# Patient Record
Sex: Female | Born: 2005 | Race: Black or African American | Hispanic: No | Marital: Single | Smoking: Never smoker
Health system: Southern US, Community
[De-identification: ages and names within clinical notes are randomized; demographics above are authoritative.]

## PROBLEM LIST (undated history)

## (undated) DIAGNOSIS — L309 Dermatitis, unspecified: Secondary | ICD-10-CM

## (undated) DIAGNOSIS — T7840XA Allergy, unspecified, initial encounter: Secondary | ICD-10-CM

## (undated) DIAGNOSIS — N390 Urinary tract infection, site not specified: Secondary | ICD-10-CM

## (undated) DIAGNOSIS — D571 Sickle-cell disease without crisis: Secondary | ICD-10-CM

---
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
## 2023-02-19 ENCOUNTER — Other Ambulatory Visit: Payer: Self-pay

## 2023-02-19 ENCOUNTER — Ambulatory Visit (HOSPITAL_COMMUNITY)
Admission: EM | Admit: 2023-02-19 | Discharge: 2023-02-19 | Disposition: A | Discharge status: Home / Self Care | Payer: Medicaid Other | Attending: Nurse Practitioner | Admitting: Nurse Practitioner

## 2023-02-19 ENCOUNTER — Encounter (HOSPITAL_COMMUNITY): Payer: Self-pay | Admitting: Emergency Medicine

## 2023-02-19 DIAGNOSIS — S161XXA Strain of muscle, fascia and tendon at neck level, initial encounter: Secondary | ICD-10-CM

## 2023-02-19 DIAGNOSIS — S39012A Strain of muscle, fascia and tendon of lower back, initial encounter: Secondary | ICD-10-CM

## 2023-02-19 MED ORDER — CYCLOBENZAPRINE HCL 10 MG PO TABS: 10.0000 mg | ORAL_TABLET | Freq: Two times a day (BID) | ORAL | 0 refills | Status: DC
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
## 2023-02-19 NOTE — ED Notes (Signed)
Patient is pleasant, NAD.  Patient has warm fingers, compared pulse ox reading with 2 pieces of equipment and used fingers from both hands.  Patient does not have artificial nails.
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
## 2023-02-19 NOTE — Discharge Instructions (Addendum)
Start taking Tylenol 500 to 1000 mg every 6 hours as needed for pain.  Recommend starting this today.  In addition, you can alternate with ibuprofen 800 mg every 8 hours for pain.  At night time or as needed during the day if you are not going anywhere, you can take the Flexeril every 12 hours as needed for muscular pain.  Seek care if symptoms persist/worsen despite treatment.

## 2023-03-07 ENCOUNTER — Telehealth: Payer: Self-pay | Admitting: Family Medicine

## 2023-03-07 NOTE — Telephone Encounter (Signed)
NOT OUR PT. Never been seen here. Pt's PCP is not in our system so I cannot forward this message to them

## 2023-03-07 NOTE — Telephone Encounter (Signed)
Copied from CRM [id number]. Topic: Medical Record Request - Records Request >> Mar 05, 2023  1:51 PM Fuller Mandril wrote: Reason for CRM: Pt called to request that discharge paperwork from last appt during the summer be sent to [employer] so that they are able to order her supplies. Fax #: [phone]

## 2023-07-27 ENCOUNTER — Encounter (HOSPITAL_COMMUNITY): Payer: Self-pay

## 2023-07-27 ENCOUNTER — Ambulatory Visit (HOSPITAL_COMMUNITY)
Admission: EM | Admit: 2023-07-27 | Discharge: 2023-07-27 | Disposition: A | Discharge status: Home / Self Care | Attending: Family Medicine | Admitting: Family Medicine

## 2023-07-27 DIAGNOSIS — Z113 Encounter for screening for infections with a predominantly sexual mode of transmission: Secondary | ICD-10-CM | POA: Insufficient documentation

## 2023-07-27 DIAGNOSIS — K629 Disease of anus and rectum, unspecified: Secondary | ICD-10-CM | POA: Insufficient documentation

## 2023-07-27 MED ORDER — LIDOCAINE 5 % EX OINT: 1.0000 | TOPICAL_OINTMENT | Freq: Two times a day (BID) | CUTANEOUS | 0 refills | Status: DC | PRN

## 2023-07-27 MED ORDER — VALACYCLOVIR HCL 1 G PO TABS: 1000.0000 mg | ORAL_TABLET | Freq: Two times a day (BID) | ORAL | 0 refills | Status: AC

## 2023-07-27 NOTE — ED Triage Notes (Signed)
 Patient would like STI testing. Patient reports blister around her anus area x 3 days.

## 2023-07-27 NOTE — Discharge Instructions (Addendum)
  It was nice seeing you today. 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 I have e-scribed your medication to the pharmacy. Please take: Valacyclovir as instructed  Everett Hitt, our registered nurse, will call you about all your test results and follow-up plans if needed. Please follow up with your PCP soon and return to us  or the emergency department if your symptoms worsen.  Best regards, [facility] Provider

## 2023-07-27 NOTE — ED Provider Notes (Addendum)
 [facility]    CSN: [id number] Arrival date & time: 07/27/23  1635      History   Chief Complaint No chief complaint on file.   HPI Jessica Macias is a 18 y.o. female.   The history is provided by the patient. No language interpreter was used.  Rash Location: C/O bumpy rash on her anus x  4 days. Quality: burning and itchiness   Severity:  Moderate Onset quality:  Gradual Duration:  3 days Timing:  Constant Progression:  Worsening Chronicity:  New Context: not animal contact, not insect bite/sting, not medications and not sick contacts   Context comment:  She is sexually active. Denies anual sex. Symptoms started after she got [place] wax Relieved by:  Nothing Worsened by:  Nothing Ineffective treatments: Warm compression. Associated symptoms: no abdominal pain, no diarrhea, no fever, no nausea and not vomiting   Associated symptoms comment:  No vaginal discharge, but wants STD test. Had recent HIV test done. She does not want blood test today STD screen: LMP:07/07/23 [place] wax recently  Past Medical History:  Diagnosis Date  . Allergy    seasonal allergies  . Eczema   . Sickle cell anemia (HCC)    dx at birth  . Urinary tract infection     Patient Active Problem List   Diagnosis Date Noted  . Hypoxia 09/26/2022  . Sickle cell crisis (HCC) 09/21/2022  . Sickle cell anemia (HCC) 02/19/2012  . Acute chest syndrome (HCC) 02/19/2012  . Fever 02/19/2012    History reviewed. No pertinent surgical history.  OB History   No obstetric history on file.      Home Medications    Prior to Admission medications   Medication Sig Start Date End Date Taking? Authorizing Provider  lidocaine  (XYLOCAINE ) 5 % ointment Apply 1 Application topically 2 (two) times daily as needed. 07/27/23  Yes Arn Lane, MD  norethindrone (MICRONOR) 0.35 MG tablet Take 1 tablet by mouth daily. 05/05/23  Yes [provider]  valACYclovir (VALTREX) 1000 MG  tablet Take 1 tablet (1,000 mg total) by mouth 2 (two) times daily for 10 days. Take for 10 days 07/27/23 08/06/23 Yes Penni Bowman T, MD  acetaminophen  (TYLENOL ) 500 MG tablet Take 1,000 mg by mouth as needed for moderate pain.    [provider]  cyclobenzaprine  (FLEXERIL ) 10 MG tablet Take 1 tablet (10 mg total) by mouth every 12 (twelve) hours. Do not take with alcohol or while driving or operating heavy machinery.  May cause drowsiness. 02/19/23   Wilhemena Harbour, NP  folic acid  (FOLVITE ) 1 MG tablet Take 1 mg by mouth daily.    [provider]  hydroxyurea  (DROXIA ) 400 MG capsule Take 1,200 mg by mouth daily.    [provider]  ibuprofen  (ADVIL ) 800 MG tablet Take 1 tablet (800 mg total) by mouth every 8 (eight) hours as needed for moderate pain, mild pain, headache or fever. 09/26/22   Sigurd Driver, FNP  oxyCODONE  (ROXICODONE ) 5 MG immediate release tablet Take 1 tablet (5 mg total) by mouth every 6 (six) hours as needed for severe pain. 09/26/22   Sigurd Driver, FNP  Vitamin D, Ergocalciferol, (DRISDOL) 1.25 MG (50000 UNIT) CAPS capsule Take 50,000 Units by mouth every 7 (seven) days.    [provider]    Family History Family History  Problem Relation Age of Onset  . Sickle cell trait Mother   . Diabetes Mother   . Hypertension Mother   .  Sickle cell trait Father   . Diabetes Maternal Grandmother   . Hypertension Maternal Grandmother   . Hyperlipidemia Maternal Grandmother     Social History Social History   Tobacco Use  . Smoking status: Never  Vaping Use  . Vaping status: Never Used  Substance Use Topics  . Alcohol use: Never  . Drug use: Never     Allergies   Patient has no known allergies.   Review of Systems Review of Systems  Constitutional:  Negative for fever.  Gastrointestinal:  Negative for abdominal pain, diarrhea, nausea and vomiting.  Skin:  Positive for rash.  All other systems reviewed and are  negative.    Physical Exam Triage Vital Signs ED Triage Vitals  Encounter Vitals Group     BP      Systolic BP Percentile      Diastolic BP Percentile      Pulse      Resp      Temp      Temp src      SpO2      Weight      Height      Head Circumference      Peak Flow      Pain Score      Pain Loc      Pain Education      Exclude from Growth Chart    No data found.  Updated Vital Signs BP 116/70 (BP Location: Left Arm)   Pulse 91   Temp 98.6 F (37 C) (Oral)   Resp 17   LMP 07/13/2023 (Approximate)   SpO2 99%   Visual Acuity Right Eye Distance:   Left Eye Distance:   Bilateral Distance:    Right Eye Near:   Left Eye Near:    Bilateral Near:     Physical Exam Vitals and nursing note reviewed. Exam conducted with a chaperone present [facility]).  Cardiovascular:     Rate and Rhythm: Normal rate and regular rhythm.     Heart sounds: Normal heart sounds. No murmur heard. Pulmonary:     Effort: Pulmonary effort is normal. No respiratory distress.     Breath sounds: Normal breath sounds. No wheezing.  Genitourinary:    Comments: 1- 2 cm round shallow perianal ulcer around 6 o'clock with a two smaller  lesions , painful to touch     UC Treatments / Results  Labs (all labs ordered are listed, but only abnormal results are displayed) Labs Reviewed  HSV CULTURE AND TYPING  CERVICOVAGINAL ANCILLARY ONLY    EKG   Radiology No results found.  Procedures Procedures (including critical care time)  Medications Ordered in UC Medications - No data to display  Initial Impression / Assessment and Plan / UC Course  I have reviewed the triage vital signs and the nursing notes.  Pertinent labs & imaging results that were available during my care of the patient were reviewed by me and considered in my medical decision making (see chart for details).  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 I have e-scribed your medication to the pharmacy. Please take: Valacyclovir as instructed  Everett Hitt, our registered nurse, will call you about all your test results and follow-up plans if needed. Please follow up with your PCP soon and return to us  or the emergency department if your symptoms worsen.  Best regards, [facility] Provider     ED Prescriptions     Medication Sig Dispense Auth. Provider   valACYclovir (VALTREX) 1000 MG tablet Take 1 tablet (1,000 mg total) by mouth 2 (two) times daily for 10 days. Take for 10 days 20 tablet Lancer Thurner, Raye Cai T, MD   lidocaine  (XYLOCAINE ) 5 % ointment Apply 1 Application topically 2 (two) times daily as needed. 35.44 g Arn Lane, MD      PDMP not reviewed this encounter.   Arn Lane, MD 07/27/23 1712    Arn Lane, MD 07/27/23 [phone]

## 2023-07-28 ENCOUNTER — Telehealth (HOSPITAL_COMMUNITY): Payer: Self-pay

## 2023-07-28 LAB — CERVICOVAGINAL ANCILLARY ONLY
Chlamydia: POSITIVE — AB
Comment: NEGATIVE
Comment: NEGATIVE
Comment: NORMAL
Neisseria Gonorrhea: NEGATIVE
Trichomonas: NEGATIVE

## 2023-07-28 MED ORDER — DOXYCYCLINE HYCLATE 100 MG PO TABS: 100.0000 mg | ORAL_TABLET | Freq: Two times a day (BID) | ORAL | 0 refills | Status: AC

## 2023-07-28 NOTE — Telephone Encounter (Signed)
 Per protocol, pt requires tx with Doxycycline.  Reviewed with patient, verified pharmacy, prescription sent.

## 2023-07-30 ENCOUNTER — Encounter: Payer: Self-pay | Admitting: Family Medicine

## 2023-08-01 LAB — HSV CULTURE AND TYPING

## 2023-08-06 ENCOUNTER — Emergency Department (HOSPITAL_COMMUNITY)

## 2023-08-06 ENCOUNTER — Other Ambulatory Visit: Payer: Self-pay

## 2023-08-06 ENCOUNTER — Emergency Department (HOSPITAL_COMMUNITY): Admission: EM | Admit: 2023-08-06 | Discharge: 2023-08-06 | Discharge status: Against Medical Advice

## 2023-08-06 ENCOUNTER — Encounter (HOSPITAL_COMMUNITY): Payer: Self-pay

## 2023-08-06 ENCOUNTER — Encounter (HOSPITAL_COMMUNITY): Payer: Self-pay | Admitting: *Deleted

## 2023-08-06 ENCOUNTER — Emergency Department (HOSPITAL_COMMUNITY)
Admission: EM | Admit: 2023-08-06 | Discharge: 2023-08-07 | Disposition: A | Discharge status: Home / Self Care | Source: Home / Self Care

## 2023-08-06 DIAGNOSIS — Z5321 Procedure and treatment not carried out due to patient leaving prior to being seen by health care provider: Secondary | ICD-10-CM | POA: Diagnosis not present

## 2023-08-06 DIAGNOSIS — R791 Abnormal coagulation profile: Secondary | ICD-10-CM | POA: Insufficient documentation

## 2023-08-06 DIAGNOSIS — M549 Dorsalgia, unspecified: Secondary | ICD-10-CM | POA: Diagnosis present

## 2023-08-06 DIAGNOSIS — D57219 Sickle-cell/Hb-C disease with crisis, unspecified: Secondary | ICD-10-CM | POA: Diagnosis not present

## 2023-08-06 DIAGNOSIS — D5700 Hb-SS disease with crisis, unspecified: Secondary | ICD-10-CM | POA: Diagnosis present

## 2023-08-06 DIAGNOSIS — D72829 Elevated white blood cell count, unspecified: Secondary | ICD-10-CM | POA: Diagnosis not present

## 2023-08-06 LAB — COMPREHENSIVE METABOLIC PANEL WITH GFR
ALT: 13 U/L (ref 0–44)
ALT: 12 U/L (ref 0–44)
AST: 37 U/L (ref 15–41)
AST: 38 U/L (ref 15–41)
Albumin: 4.5 g/dL (ref 3.5–5.0)
Albumin: 5 g/dL (ref 3.5–5.0)
Alkaline Phosphatase: 70 U/L (ref 38–126)
Alkaline Phosphatase: 91 U/L (ref 38–126)
Anion gap: 9 (ref 5–15)
Anion gap: 8 (ref 5–15)
BUN: <5 mg/dL — ABNORMAL LOW (ref 6–20)
BUN: 5 mg/dL — ABNORMAL LOW (ref 6–20)
CO2: 22 mmol/L (ref 22–32)
CO2: 24 mmol/L (ref 22–32)
Calcium: 9.6 mg/dL (ref 8.9–10.3)
Calcium: 9.9 mg/dL (ref 8.9–10.3)
Chloride: 107 mmol/L (ref 98–111)
Chloride: 107 mmol/L (ref 98–111)
Creatinine, Ser: 0.52 mg/dL (ref 0.44–1.00)
Creatinine, Ser: 0.49 mg/dL (ref 0.44–1.00)
GFR, Estimated: >60 mL/min (ref >60)
GFR, Estimated: >60 mL/min (ref >60)
Glucose, Bld: 102 mg/dL — ABNORMAL HIGH (ref 70–99)
Glucose, Bld: 105 mg/dL — ABNORMAL HIGH (ref 70–99)
Potassium: 4 mmol/L (ref 3.5–5.1)
Potassium: 3.9 mmol/L (ref 3.5–5.1)
Sodium: 138 mmol/L (ref 135–145)
Sodium: 139 mmol/L (ref 135–145)
Total Bilirubin: 1.9 mg/dL — ABNORMAL HIGH (ref 0.0–1.2)
Total Bilirubin: 2.7 mg/dL — ABNORMAL HIGH (ref 0.0–1.2)
Total Protein: 7.6 g/dL (ref 6.5–8.1)
Total Protein: 8.9 g/dL — ABNORMAL HIGH (ref 6.5–8.1)

## 2023-08-06 LAB — CBC WITH DIFFERENTIAL/PLATELET
Abs Immature Granulocytes: 0.43 10*3/uL — ABNORMAL HIGH (ref 0.00–0.07)
Abs Immature Granulocytes: 0.51 10*3/uL — ABNORMAL HIGH (ref 0.00–0.07)
Basophils Absolute: 0.1 10*3/uL (ref 0.0–0.1)
Basophils Absolute: 0.2 10*3/uL — ABNORMAL HIGH (ref 0.0–0.1)
Basophils Relative: 1 %
Basophils Relative: 1 %
Eosinophils Absolute: 0 10*3/uL (ref 0.0–0.5)
Eosinophils Absolute: 0 10*3/uL (ref 0.0–0.5)
Eosinophils Relative: 0 %
Eosinophils Relative: 0 %
HCT: 21.4 % — ABNORMAL LOW (ref 36.0–46.0)
HCT: 22.3 % — ABNORMAL LOW (ref 36.0–46.0)
Hemoglobin: 7.5 g/dL — ABNORMAL LOW (ref 12.0–15.0)
Hemoglobin: 7.8 g/dL — ABNORMAL LOW (ref 12.0–15.0)
Immature Granulocytes: 2 %
Immature Granulocytes: 2 %
Lymphocytes Relative: 19 %
Lymphocytes Relative: 10 %
Lymphs Abs: 3.7 10*3/uL (ref 0.7–4.0)
Lymphs Abs: 2.4 10*3/uL (ref 0.7–4.0)
MCH: 30 pg (ref 26.0–34.0)
MCH: 29.9 pg (ref 26.0–34.0)
MCHC: 35 g/dL (ref 30.0–36.0)
MCHC: 35 g/dL (ref 30.0–36.0)
MCV: 85.6 fL (ref 80.0–100.0)
MCV: 85.4 fL (ref 80.0–100.0)
Monocytes Absolute: 1.8 10*3/uL — ABNORMAL HIGH (ref 0.1–1.0)
Monocytes Absolute: 2 10*3/uL — ABNORMAL HIGH (ref 0.1–1.0)
Monocytes Relative: 10 %
Monocytes Relative: 9 %
Neutro Abs: 13.1 10*3/uL — ABNORMAL HIGH (ref 1.7–7.7)
Neutro Abs: 17.9 10*3/uL — ABNORMAL HIGH (ref 1.7–7.7)
Neutrophils Relative %: 68 %
Neutrophils Relative %: 78 %
Platelets: 638 10*3/uL — ABNORMAL HIGH (ref 150–400)
Platelets: 663 10*3/uL — ABNORMAL HIGH (ref 150–400)
RBC: 2.5 MIL/uL — ABNORMAL LOW (ref 3.87–5.11)
RBC: 2.61 MIL/uL — ABNORMAL LOW (ref 3.87–5.11)
RDW: 24.6 % — ABNORMAL HIGH (ref 11.5–15.5)
RDW: 24.8 % — ABNORMAL HIGH (ref 11.5–15.5)
WBC: 19.1 10*3/uL — ABNORMAL HIGH (ref 4.0–10.5)
WBC: 22.9 10*3/uL — ABNORMAL HIGH (ref 4.0–10.5)
nRBC: 1.4 % — ABNORMAL HIGH (ref 0.0–0.2)
nRBC: 1.7 % — ABNORMAL HIGH (ref 0.0–0.2)

## 2023-08-06 LAB — TROPONIN I (HIGH SENSITIVITY)
Troponin I (High Sensitivity): <2 ng/L (ref <18)
Troponin I (High Sensitivity): 3 ng/L (ref <18)

## 2023-08-06 LAB — HCG, SERUM, QUALITATIVE
Preg, Serum: NEGATIVE
Preg, Serum: NEGATIVE

## 2023-08-06 LAB — RETICULOCYTES
Immature Retic Fract: 24.7 % — ABNORMAL HIGH (ref 2.3–15.9)
RBC.: 2.61 MIL/uL — ABNORMAL LOW (ref 3.87–5.11)
Retic Count, Absolute: 385.5 10*3/uL — ABNORMAL HIGH (ref 19.0–186.0)
Retic Ct Pct: 14 % — ABNORMAL HIGH (ref 0.4–3.1)

## 2023-08-06 LAB — D-DIMER, QUANTITATIVE: D-Dimer, Quant: 9.87 ug{FEU}/mL — ABNORMAL HIGH (ref 0.00–0.50)

## 2023-08-06 MED ORDER — IOHEXOL 350 MG/ML SOLN
75.0000 mL | Freq: Once | INTRAVENOUS | Status: AC | PRN
  Administered 2023-08-06: 75 mL via INTRAVENOUS

## 2023-08-06 MED ORDER — DEXTROSE-SODIUM CHLORIDE 5-0.45 % IV SOLN: INTRAVENOUS | Status: DC

## 2023-08-06 MED ORDER — HYDROMORPHONE HCL 1 MG/ML IJ SOLN
1.0000 mg | INTRAMUSCULAR | Status: AC
  Administered 2023-08-06: 1 mg via INTRAVENOUS
  Filled 2023-08-06: qty 1

## 2023-08-06 MED ORDER — KETOROLAC TROMETHAMINE 15 MG/ML IJ SOLN
15.0000 mg | Freq: Once | INTRAMUSCULAR | Status: AC
  Administered 2023-08-06: 15 mg via INTRAMUSCULAR
  Filled 2023-08-06: qty 1

## 2023-08-06 MED ORDER — HYDROMORPHONE HCL 1 MG/ML IJ SOLN
1.0000 mg | INTRAMUSCULAR | Status: DC
  Filled 2023-08-06: qty 1

## 2023-08-06 MED ORDER — HYDROMORPHONE HCL 1 MG/ML IJ SOLN
0.5000 mg | INTRAMUSCULAR | Status: AC
  Administered 2023-08-06: 0.5 mg via INTRAVENOUS
  Filled 2023-08-06: qty 1

## 2023-08-06 NOTE — ED Triage Notes (Signed)
 The  pt has had abd pain since this am   she has sickle cell   lmp  last week

## 2023-08-06 NOTE — ED Provider Triage Note (Cosign Needed)
 Emergency Medicine Provider Triage Evaluation Note  Haiven Mccrae , a 18 y.o. female  was evaluated in triage.  Pt complains of pain Hx sickle cell.  Review of Systems  Positive: Back pain and chest pain Negative: Nausea, vomiting, diarrhea, shortness of breath, abdominal pain  Physical Exam  BP 116/65   Pulse 79   Temp 98.6 F (37 C)   Resp 17   LMP 07/13/2023 (Approximate)   SpO2 97%  Gen:   Awake, crying Resp:  Normal effort  MSK:   Moves extremities without difficulty  Other:    Medical Decision Making  Medically screening exam initiated at 5:53 PM.  Appropriate orders placed.  Verma Hosea was informed that the remainder of the evaluation will be completed by another provider, this initial triage assessment does not replace that evaluation, and the importance of remaining in the ED until their evaluation is complete.  Labs ordered   Jayleah Garbers N, PA-C 08/06/23 1758

## 2023-08-06 NOTE — ED Provider Notes (Incomplete)
 Accepted handoff at shift change from [place], [place]. Please see prior provider note for more detail.   Briefly: Patient is 18 y.o.   DDX: concern for SCC, chronic pain, acute chest syndrome  Plan: Await repeat troponin and CT angio chest for final disposition.  Physical Exam  BP (!) 109/54   Pulse 71   Temp 98.8 F (37.1 C) (Oral)   Resp (!) 23   Ht 5\' 9"  (1.753 m)   Wt 65.8 kg   LMP 07/13/2023 (Approximate)   SpO2 94%   BMI 21.41 kg/m   Physical Exam Vitals and nursing note reviewed.  Constitutional:      General: She is not in acute distress.    Appearance: Normal appearance. She is not ill-appearing or toxic-appearing.  HENT:     Head: Normocephalic and atraumatic.     Mouth/Throat:     Mouth: Mucous membranes are moist.  Eyes:     General: No scleral icterus.       Right eye: No discharge.        Left eye: No discharge.     Conjunctiva/sclera: Conjunctivae normal.  Cardiovascular:     Rate and Rhythm: Normal rate and regular rhythm.     Pulses: Normal pulses.     Heart sounds: Normal heart sounds. No murmur heard. Pulmonary:     Effort: Pulmonary effort is normal. No respiratory distress.     Breath sounds: Normal breath sounds. No wheezing, rhonchi or rales.  Abdominal:     General: Abdomen is flat. Bowel sounds are normal. There is no distension.     Palpations: Abdomen is soft. There is no mass.     Tenderness: There is no abdominal tenderness.  Musculoskeletal:     Right lower leg: No edema.     Left lower leg: No edema.  Skin:    General: Skin is warm and dry.     Findings: No rash.  Neurological:     General: No focal deficit present.     Mental Status: She is alert and oriented to person, place, and time. Mental status is at baseline.  Psychiatric:        Mood and Affect: Mood normal.     Procedures  Procedures  ED Course / MDM    Medical Decision Making Amount and/or Complexity of Data Reviewed Labs: ordered. Radiology:  ordered.  Risk Prescription drug management.   ***

## 2023-08-06 NOTE — ED Provider Notes (Signed)
 [place] EMERGENCY DEPARTMENT AT [facility] Provider Note   CSN: [id number] Arrival date & time: 08/06/23  1928     History  Chief Complaint  Patient presents with   Sickle Cell Pain Crisis    Jessica Macias is a 18 y.o. female with PMHx eczema, sickle cell anemia who presents to ED concerned for sickle cell pain crisis. Patient endorsing chest and low back pain which is typical for her sickle cell pain crisis. Pain started around 10AM. Patient has taken 5mg  oxycodone  and 800mg  Advil  around 2PM without relief in symptoms. Patient also endorsing SOB.   Denies fever, cough, nausea, vomiting, diarrhea.    Sickle Cell Pain Crisis Associated symptoms: chest pain        Home Medications Prior to Admission medications   Medication Sig Start Date End Date Taking? Authorizing Provider  acetaminophen  (TYLENOL ) 500 MG tablet Take 1,000 mg by mouth as needed for moderate pain.    [provider]  cyclobenzaprine  (FLEXERIL ) 10 MG tablet Take 1 tablet (10 mg total) by mouth every 12 (twelve) hours. Do not take with alcohol or while driving or operating heavy machinery.  May cause drowsiness. 02/19/23   Wilhemena Harbour, NP  folic acid  (FOLVITE ) 1 MG tablet Take 1 mg by mouth daily.    [provider]  hydroxyurea  (DROXIA ) 400 MG capsule Take 1,200 mg by mouth daily.    [provider]  ibuprofen  (ADVIL ) 800 MG tablet Take 1 tablet (800 mg total) by mouth every 8 (eight) hours as needed for moderate pain, mild pain, headache or fever. 09/26/22   Sigurd Driver, FNP  lidocaine  (XYLOCAINE ) 5 % ointment Apply 1 Application topically 2 (two) times daily as needed. 07/27/23   Arn Lane, MD  norethindrone (MICRONOR) 0.35 MG tablet Take 1 tablet by mouth daily. 05/05/23   [provider]  oxyCODONE  (ROXICODONE ) 5 MG immediate release tablet Take 1 tablet (5 mg total) by mouth every 6 (six) hours as needed for severe pain. 09/26/22   Sigurd Driver, FNP  valACYclovir  (VALTREX ) 1000 MG tablet Take 1 tablet (1,000 mg total) by mouth 2 (two) times daily for 10 days. Take for 10 days 07/27/23 08/06/23  Arn Lane, MD  Vitamin D, Ergocalciferol, (DRISDOL) 1.25 MG (50000 UNIT) CAPS capsule Take 50,000 Units by mouth every 7 (seven) days.    [provider]      Allergies    Patient has no known allergies.    Review of Systems   Review of Systems  Cardiovascular:  Positive for chest pain.    Physical Exam Updated Vital Signs BP 123/62   Pulse 93   Temp 98.8 F (37.1 C) (Oral)   Resp 20   Ht 5\' 9"  (1.753 m)   Wt 65.8 kg   LMP 07/13/2023 (Approximate)   SpO2 98%   BMI 21.41 kg/m  Physical Exam Vitals and nursing note reviewed.  Constitutional:      General: She is in acute distress.     Appearance: She is not ill-appearing or toxic-appearing.  HENT:     Head: Normocephalic and atraumatic.     Mouth/Throat:     Mouth: Mucous membranes are moist.  Eyes:     General: No scleral icterus.       Right eye: No discharge.        Left eye: No discharge.     Conjunctiva/sclera: Conjunctivae normal.  Cardiovascular:     Rate and Rhythm:  Normal rate and regular rhythm.     Pulses: Normal pulses.     Heart sounds: Normal heart sounds. No murmur heard. Pulmonary:     Effort: Pulmonary effort is normal. No respiratory distress.     Breath sounds: Normal breath sounds. No wheezing, rhonchi or rales.  Abdominal:     General: Abdomen is flat. Bowel sounds are normal. There is no distension.     Palpations: Abdomen is soft. There is no mass.     Tenderness: There is no abdominal tenderness.  Musculoskeletal:     Right lower leg: No edema.     Left lower leg: No edema.  Skin:    General: Skin is warm and dry.     Findings: No rash.  Neurological:     General: No focal deficit present.     Mental Status: She is alert and oriented to person, place, and time. Mental status is at baseline.  Psychiatric:         Mood and Affect: Mood normal.     ED Results / Procedures / Treatments   Labs (all labs ordered are listed, but only abnormal results are displayed) Labs Reviewed  CBC WITH DIFFERENTIAL/PLATELET - Abnormal; Notable for the following components:      Result Value   WBC 22.9 (*)    RBC 2.61 (*)    Hemoglobin 7.8 (*)    HCT 22.3 (*)    RDW 24.8 (*)    Platelets 663 (*)    nRBC 1.7 (*)    All other components within normal limits  COMPREHENSIVE METABOLIC PANEL WITH GFR - Abnormal; Notable for the following components:   Glucose, Bld 105 (*)    BUN 5 (*)    Total Protein 8.9 (*)    Total Bilirubin 2.7 (*)    All other components within normal limits  D-DIMER, QUANTITATIVE (NOT AT [facility]) - Abnormal; Notable for the following components:   D-Dimer, Quant 9.87 (*)    All other components within normal limits  HCG, SERUM, QUALITATIVE  RETICULOCYTES  TROPONIN I (HIGH SENSITIVITY)  TROPONIN I (HIGH SENSITIVITY)    EKG None  Radiology DG Chest Port 1 View Result Date: 08/06/2023 CLINICAL DATA:  Sickle cell crisis EXAM: PORTABLE CHEST 1 VIEW COMPARISON:  09/23/2022 FINDINGS: Lungs are clear. No pneumothorax or pleural effusion. Cardiac size is enlarged. Pulmonary vascularity is normal. No acute bone abnormality. IMPRESSION: 1. Cardiomegaly. Electronically Signed   By: Worthy Heads M.D.   On: 08/06/2023 20:20    Procedures Procedures    Medications Ordered in ED Medications  HYDROmorphone  (DILAUDID ) injection 1 mg (has no administration in time range)  dextrose  5 % and 0.45 % NaCl infusion ( Intravenous New Bag/Given 08/06/23 2047)  HYDROmorphone  (DILAUDID ) injection 0.5 mg (0.5 mg Intravenous Given 08/06/23 2040)  HYDROmorphone  (DILAUDID ) injection 1 mg (1 mg Intravenous Given 08/06/23 2125)  ketorolac  (TORADOL ) 15 MG/ML injection 15 mg (15 mg Intramuscular Given 08/06/23 2127)    ED Course/ Medical Decision Making/ A&P                                 Medical Decision  Making Amount and/or Complexity of Data Reviewed Labs: ordered. Radiology: ordered.  Risk Prescription drug management.   This patient presents to the ED for concern of sickle cell pain, this involves an extensive number of treatment options, and is a complaint that carries with it a high risk  of complications and morbidity.  The differential diagnosis includes sickle cell pain crisis, Anemia, aplastic crisis, acute chest syndrome/fat embolism, AKI, VTE.   Co morbidities that complicate the patient evaluation  eczema, sickle cell anemia     Problem List / ED Course / Critical interventions / Medication management  Patient presented for sickle cell pain with chest pain, low back pain, and SOB. Patient stating that these symptoms are typical for her sickle cell crisis. Sickle cell labs and imaging ordered along with fluids and pain management. Patient is obviously in pain - rest of physical exam is reassuring. Patient afebrile with stable vitals.  I Ordered, and personally interpreted labs.  CBC with leukocytosis at 22.9.  There is no anemia with hemoglobin at 7.8.  hCG negative.  CMP with T. bili elevated at 2.7.  D-dimer elevated at 9.87.  Initial troponin within normal limits. Reticulocytes elevated appropriately. I ordered imaging studies including chest xray and CTA chest . I independently visualized and interpreted imaging. I agree with the radiologist chet xray interpretation of no acute process. CTA chest imaging pending. The patient was maintained on a cardiac monitor.  I personally viewed and interpreted the EKG/cardiac monitored which showed an underlying rhythm of: sinus rhythm Provided patient with dilaudid , toradol , and IV fluid infusion. Patient feeling better after the 2nd dose of dilaudid . I have reviewed the patients home medicines and have made adjustments as needed  Social Determinants of Health:  none   10PM Care of Jessica Macias transferred to PA Ariel at the end  of my shift as the patient will require reassessment once labs/imaging have resulted. Patient presentation, ED course, and plan of care discussed with review of all pertinent labs and imaging. Please see his/her note for further details regarding further ED course and disposition. Plan at time of handoff is reassess patient after imaging and final troponin. Patient may need admission if her pain is not controlled. This may be altered or completely changed at the discretion of the oncoming team pending results of further workup.         Final Clinical Impression(s) / ED Diagnoses Final diagnoses:  Sickle cell pain crisis [facility])    Rx / DC Orders ED Discharge Orders     None         [place], [place] 08/06/23 2203    Rolinda Climes, DO 08/07/23 0001

## 2023-08-06 NOTE — ED Provider Notes (Signed)
 Accepted handoff at shift change from [place], [place]. Please see prior provider note for more detail.   Briefly: Patient is 18 y.o.   DDX: concern for SCC, chronic pain, acute chest syndrome  Plan: Await repeat troponin and CT angio chest for final disposition.  Physical Exam  BP (!) 109/54   Pulse 71   Temp 98.8 F (37.1 C) (Oral)   Resp (!) 23   Ht 5\' 9"  (1.753 m)   Wt 65.8 kg   LMP 07/13/2023 (Approximate)   SpO2 94%   BMI 21.41 kg/m   Physical Exam Vitals and nursing note reviewed.  Constitutional:      General: She is not in acute distress.    Appearance: Normal appearance. She is not ill-appearing or toxic-appearing.  HENT:     Head: Normocephalic and atraumatic.     Mouth/Throat:     Mouth: Mucous membranes are moist.  Eyes:     General: No scleral icterus.       Right eye: No discharge.        Left eye: No discharge.     Conjunctiva/sclera: Conjunctivae normal.  Cardiovascular:     Rate and Rhythm: Normal rate and regular rhythm.     Pulses: Normal pulses.     Heart sounds: Normal heart sounds. No murmur heard. Pulmonary:     Effort: Pulmonary effort is normal. No respiratory distress.     Breath sounds: Normal breath sounds. No wheezing, rhonchi or rales.  Abdominal:     General: Abdomen is flat. Bowel sounds are normal. There is no distension.     Palpations: Abdomen is soft. There is no mass.     Tenderness: There is no abdominal tenderness.  Musculoskeletal:     Right lower leg: No edema.     Left lower leg: No edema.  Skin:    General: Skin is warm and dry.     Findings: No rash.  Neurological:     General: No focal deficit present.     Mental Status: She is alert and oriented to person, place, and time. Mental status is at baseline.  Psychiatric:        Mood and Affect: Mood normal.    Procedures  Procedures  ED Course / MDM    Medical Decision Making Amount and/or Complexity of Data Reviewed Labs: ordered. Radiology:  ordered.  Risk Prescription drug management.   Patient CT angio chest is negative for any findings to suggest PE.  On reassessment, patient does report improvement in her pain earlier radiated 5 or 6 out of 10.  She reports that when she arrived she was initially at a 9 or 10 out of 10 pain.  She does feel improvement.  Repeated dose of Dilaudid  0.5 mg as patient was about 2 hours from her last dose and was increasing in pain.  Will reassess shortly.  On reevaluation, patient does report continued pain although improving again once today 5 or 6.  She feels comfortable trying to be discharged home with p.o. medications.  Will send a short refill of her home oxycodone  but did advise that patient should follow-up with the sickle cell clinic if she continues to have persistent pain.       Akeia Perot A, PA-C 08/07/23 0221    Lindle Rhea, MD 08/07/23 [phone]

## 2023-08-06 NOTE — ED Notes (Signed)
 Pt requesting to leave, PA informed pt a room should be available soon. Pt ambulated toward lobby

## 2023-08-06 NOTE — ED Triage Notes (Signed)
 Sickle cell crisis that started today, last dose of 800mg  ibuprofen  2 hours PTA. C/o chest pain and back pain.

## 2023-08-07 MED ORDER — OXYCODONE HCL 5 MG PO TABS: 5.0000 mg | ORAL_TABLET | ORAL | 0 refills | Status: DC | PRN

## 2023-08-07 MED ORDER — HYDROMORPHONE HCL 1 MG/ML IJ SOLN
0.5000 mg | Freq: Once | INTRAMUSCULAR | Status: AC
  Administered 2023-08-07: 0.5 mg via INTRAVENOUS
  Filled 2023-08-07: qty 1

## 2023-08-07 NOTE — Discharge Instructions (Signed)
 You are seen in the emergency department today for concerns of sickle cell pain crisis.  You appear to have responded well to IV pain medications.  I have sent a short refill of your home oxycodone  to your pharmacy.  Please reach out to your team at the sickle cell clinic for further evaluation.  For any concerns of new or worsening symptoms, return to the emergency department or follow-up with your sickle cell team.

## 2023-11-15 ENCOUNTER — Ambulatory Visit (HOSPITAL_COMMUNITY)
Admission: EM | Admit: 2023-11-15 | Discharge: 2023-11-15 | Disposition: A | Discharge status: Home / Self Care | Attending: Family Medicine | Admitting: Family Medicine

## 2023-11-15 ENCOUNTER — Encounter (HOSPITAL_COMMUNITY): Payer: Self-pay

## 2023-11-15 DIAGNOSIS — Z113 Encounter for screening for infections with a predominantly sexual mode of transmission: Secondary | ICD-10-CM | POA: Diagnosis present

## 2023-11-15 DIAGNOSIS — J029 Acute pharyngitis, unspecified: Secondary | ICD-10-CM | POA: Diagnosis present

## 2023-11-15 DIAGNOSIS — Z3202 Encounter for pregnancy test, result negative: Secondary | ICD-10-CM

## 2023-11-15 LAB — POCT URINALYSIS DIP (MANUAL ENTRY)
Bilirubin, UA: NEGATIVE
Blood, UA: NEGATIVE
Glucose, UA: NEGATIVE mg/dL
Ketones, POC UA: NEGATIVE mg/dL
Leukocytes, UA: NEGATIVE
Nitrite, UA: NEGATIVE
Protein Ur, POC: NEGATIVE mg/dL
Spec Grav, UA: 1.015 (ref 1.010–1.025)
Urobilinogen, UA: >=8 U/dL — AB
pH, UA: 6 (ref 5.0–8.0)

## 2023-11-15 LAB — POCT RAPID STREP A (OFFICE): Rapid Strep A Screen: NEGATIVE

## 2023-11-15 LAB — POCT URINE PREGNANCY: Preg Test, Ur: NEGATIVE

## 2023-11-15 LAB — HIV ANTIBODY (ROUTINE TESTING W REFLEX): HIV Screen 4th Generation wRfx: NONREACTIVE

## 2023-11-15 NOTE — ED Provider Notes (Signed)
 [facility]    CSN: [id number] Arrival date & time: 11/15/23  1016      History   Chief Complaint Chief Complaint  Patient presents with   Sore Throat   Vaginal Itching   SEXUALLY TRANSMITTED DISEASE    HPI Jessica Macias is a 18 y.o. female.   Patient presents for STD testing.  She reports she is asymptomatic at this time.  She does complain of a sore throat that started about a week ago.  Denies congestion, rhinorrhea, cough, fever, chills.    Past Medical History:  Diagnosis Date   Allergy    seasonal allergies   Eczema    Sickle cell anemia (HCC)    dx at birth   Urinary tract infection     Patient Active Problem List   Diagnosis Date Noted   Hypoxia 09/26/2022   Sickle cell crisis (HCC) 09/21/2022   Sickle cell anemia (HCC) 02/19/2012   Acute chest syndrome (HCC) 02/19/2012   Fever 02/19/2012    History reviewed. No pertinent surgical history.  OB History   No obstetric history on file.      Home Medications    Prior to Admission medications   Medication Sig Start Date End Date Taking? Authorizing Provider  hydroxyurea  (DROXIA ) 400 MG capsule Take 1,200 mg by mouth daily.   Yes [provider]  oxyCODONE  (ROXICODONE ) 5 MG immediate release tablet Take 1 tablet (5 mg total) by mouth every 6 (six) hours as needed for severe pain. 09/26/22  Yes Tilford Bertram HERO, FNP  acetaminophen  (TYLENOL ) 500 MG tablet Take 1,000 mg by mouth as needed for moderate pain.    [provider]  cyclobenzaprine  (FLEXERIL ) 10 MG tablet Take 1 tablet (10 mg total) by mouth every 12 (twelve) hours. Do not take with alcohol or while driving or operating heavy machinery.  May cause drowsiness. 02/19/23   Chandra Harlene LABOR, NP  folic acid  (FOLVITE ) 1 MG tablet Take 1 mg by mouth daily.    [provider]  ibuprofen  (ADVIL ) 800 MG tablet Take 1 tablet (800 mg total) by mouth every 8 (eight) hours as needed for moderate pain, mild pain,  headache or fever. 09/26/22   Tilford Bertram HERO, FNP  lidocaine  (XYLOCAINE ) 5 % ointment Apply 1 Application topically 2 (two) times daily as needed. 07/27/23   Anders Otto DASEN, MD  metroNIDAZOLE (FLAGYL) 500 MG tablet Take 500 mg by mouth 3 (three) times daily. 02/17/23   [provider]  nitrofurantoin, macrocrystal-monohydrate, (MACROBID) 100 MG capsule Take 100 mg by mouth 2 (two) times daily. 05/22/23   [provider]  norethindrone (MICRONOR) 0.35 MG tablet Take 1 tablet by mouth daily. 05/05/23   [provider]  oxyCODONE  (ROXICODONE ) 5 MG immediate release tablet Take 1 tablet (5 mg total) by mouth every 4 (four) hours as needed for severe pain (pain score 7-10). 08/07/23   Zelaya, Oscar A, PA-C  Vitamin D, Ergocalciferol, (DRISDOL) 1.25 MG (50000 UNIT) CAPS capsule Take 50,000 Units by mouth every 7 (seven) days.    [provider]    Family History Family History  Problem Relation Age of Onset   Sickle cell trait Mother    Diabetes Mother    Hypertension Mother    Sickle cell trait Father    Diabetes Maternal Grandmother    Hypertension Maternal Grandmother    Hyperlipidemia Maternal Grandmother     Social History Social History   Tobacco Use   Smoking status: Never  Vaping Use   Vaping status: Never Used  Substance Use Topics   Alcohol use: Never   Drug use: Never     Allergies   Patient has no known allergies.   Review of Systems Review of Systems  Constitutional:  Negative for chills and fever.  HENT:  Positive for sore throat. Negative for ear pain.   Eyes:  Negative for pain and visual disturbance.  Respiratory:  Negative for cough and shortness of breath.   Cardiovascular:  Negative for chest pain and palpitations.  Gastrointestinal:  Negative for abdominal pain and vomiting.  Genitourinary:  Negative for dysuria and hematuria.  Musculoskeletal:  Negative for arthralgias and back pain.  Skin:  Negative for color change  and rash.  Neurological:  Negative for seizures and syncope.  All other systems reviewed and are negative.    Physical Exam Triage Vital Signs ED Triage Vitals [11/15/23 1115]  Encounter Vitals Group     BP 112/72     Girls Systolic BP Percentile      Girls Diastolic BP Percentile      Boys Systolic BP Percentile      Boys Diastolic BP Percentile      Pulse Rate 89     Resp 20     Temp 98.4 F (36.9 C)     Temp Source Oral     SpO2 98 %     Weight      Height      Head Circumference      Peak Flow      Pain Score      Pain Loc      Pain Education      Exclude from Growth Chart    No data found.  Updated Vital Signs BP 112/72 (BP Location: Left Arm)   Pulse 89   Temp 98.4 F (36.9 C) (Oral)   Resp 20   LMP 11/03/2023 (Approximate)   SpO2 98%   Visual Acuity Right Eye Distance:   Left Eye Distance:   Bilateral Distance:    Right Eye Near:   Left Eye Near:    Bilateral Near:     Physical Exam Vitals and nursing note reviewed.  Constitutional:      General: She is not in acute distress.    Appearance: She is well-developed.  HENT:     Head: Normocephalic and atraumatic.  Eyes:     Conjunctiva/sclera: Conjunctivae normal.  Cardiovascular:     Rate and Rhythm: Normal rate and regular rhythm.     Heart sounds: No murmur heard. Pulmonary:     Effort: Pulmonary effort is normal. No respiratory distress.     Breath sounds: Normal breath sounds.  Abdominal:     Palpations: Abdomen is soft.     Tenderness: There is no abdominal tenderness.  Musculoskeletal:        General: No swelling.     Cervical back: Neck supple.  Skin:    General: Skin is warm and dry.     Capillary Refill: Capillary refill takes less than 2 seconds.  Neurological:     Mental Status: She is alert.  Psychiatric:        Mood and Affect: Mood normal.      UC Treatments / Results  Labs (all labs ordered are listed, but only abnormal results are displayed) Labs Reviewed  POCT  URINALYSIS DIP (MANUAL ENTRY) - Abnormal; Notable for the following components:      Result Value   Urobilinogen, UA >=  8.0 (*)    All other components within normal limits  HIV ANTIBODY (ROUTINE TESTING W REFLEX)  RPR  POCT URINE PREGNANCY  POCT RAPID STREP A (OFFICE)  CERVICOVAGINAL ANCILLARY ONLY    EKG   Radiology No results found.  Procedures Procedures (including critical care time)  Medications Ordered in UC Medications - No data to display  Initial Impression / Assessment and Plan / UC Course  I have reviewed the triage vital signs and the nursing notes.  Pertinent labs & imaging results that were available during my care of the patient were reviewed by me and considered in my medical decision making (see chart for details).     STD screening today in clinic.  HIV and RPR labs pending.  Cervicovaginal swab swab in clinic today.  Will treat if indicated based on results from these test. Pharyngitis.  Strep negative.  Advised supportive care.  Return precautions discussed. Final Clinical Impressions(s) / UC Diagnoses   Final diagnoses:  Screen for STD (sexually transmitted disease)  Pharyngitis, unspecified etiology     Discharge Instructions      Will call with test results and initiate treatment if indicated. For sore throat recommend Tylenol  ibuprofen  as needed. Can also do warm salt water gargles. If no improvement or symptoms become worse please return for evaluation.   ED Prescriptions   None    PDMP not reviewed this encounter.   Ward, Harlene PEDLAR, PA-C 11/15/23 1235

## 2023-11-15 NOTE — ED Triage Notes (Signed)
 Patient presents to office for sore throat x 1 week.Denies any other symptoms.  Patient would like to have STD testing with blood work. No symptoms at this time.

## 2023-11-15 NOTE — Discharge Instructions (Signed)
 Will call with test results and initiate treatment if indicated. For sore throat recommend Tylenol  ibuprofen  as needed. Can also do warm salt water gargles. If no improvement or symptoms become worse please return for evaluation.

## 2023-11-16 LAB — RPR: RPR Ser Ql: NONREACTIVE

## 2023-11-17 LAB — CERVICOVAGINAL ANCILLARY ONLY
Bacterial Vaginitis (gardnerella): POSITIVE — AB
Candida Glabrata: NEGATIVE
Candida Vaginitis: POSITIVE — AB
Chlamydia: NEGATIVE
Comment: NEGATIVE
Comment: NEGATIVE
Comment: NEGATIVE
Comment: NEGATIVE
Comment: NEGATIVE
Comment: NORMAL
Neisseria Gonorrhea: NEGATIVE
Trichomonas: NEGATIVE

## 2023-11-20 ENCOUNTER — Ambulatory Visit (HOSPITAL_COMMUNITY): Payer: Self-pay

## 2023-11-20 MED ORDER — FLUCONAZOLE 150 MG PO TABS: 150.0000 mg | ORAL_TABLET | Freq: Once | ORAL | 0 refills | Status: AC

## 2024-01-22 ENCOUNTER — Encounter (HOSPITAL_COMMUNITY): Payer: Self-pay | Admitting: Emergency Medicine

## 2024-01-22 ENCOUNTER — Ambulatory Visit (HOSPITAL_COMMUNITY)
Admission: EM | Admit: 2024-01-22 | Discharge: 2024-01-22 | Disposition: A | Discharge status: Home / Self Care | Attending: Internal Medicine | Admitting: Internal Medicine

## 2024-01-22 DIAGNOSIS — Z113 Encounter for screening for infections with a predominantly sexual mode of transmission: Secondary | ICD-10-CM | POA: Diagnosis present

## 2024-01-22 DIAGNOSIS — R0902 Hypoxemia: Secondary | ICD-10-CM | POA: Insufficient documentation

## 2024-01-22 DIAGNOSIS — B009 Herpesviral infection, unspecified: Secondary | ICD-10-CM | POA: Insufficient documentation

## 2024-01-22 MED ORDER — VALACYCLOVIR HCL 1 G PO TABS: 1000.0000 mg | ORAL_TABLET | Freq: Two times a day (BID) | ORAL | 0 refills | Status: AC

## 2024-01-22 NOTE — Discharge Instructions (Addendum)
 Herpes outbreak: Will send in Valtrex  1000 mg twice daily for 10 days.  Recommend talking to your primary care provider about potentially going on a suppressive dose for recurrent outbreaks.  Screening swab done today and results will be available in 24-48 hours. We will contact you if we need to arrange additional treatment based on your testing. Negative results will be on your MyChart account. Abstain from sex until you receive your final results.  Use a condom for sexual encounters. If you have any worsening or changing symptoms including abnormal discharge, pelvic pain, abdominal pain, fever, nausea, or vomiting, then you should be reevaluated.   Today in clinic there was a low oxygen level of 88%.  This was double checked and verified.  This is asymptomatic.  Physical exam findings are reassuring.  Recommend contacting your sickle cell provider today to notify them of this abnormality.  If you do develop symptoms including shortness of breath, chest pain, severe fatigue or signs of a sickle cell crisis then recommend going to the emergency room immediately for further evaluation

## 2024-01-22 NOTE — ED Triage Notes (Signed)
 Pt requesting STD testing. Denies any new known exposures. Reports hx herpes, this last out break has lasted a month.

## 2024-01-22 NOTE — ED Notes (Signed)
 Tried with multiple fingers and two different pulse ox, patient O2 saturation maintaining at 87% on both pulse ox machines.  Pt reports that her O2 not normally that low unless when she is in the hospital with sickle cell flare up.  Pt denies any SOB or trouble breathing.  This RN made [place], [place] aware of patient O2 saturation. No new orders at this time.

## 2024-01-22 NOTE — ED Provider Notes (Signed)
 [facility]    CSN: [id number] Arrival date & time: 01/22/24  1244      History   Chief Complaint Chief Complaint  Patient presents with   SEXUALLY TRANSMITTED DISEASE    HPI Jessica Macias is a 18 y.o. female.   18 year old female who presents urgent care requesting STI testing and requesting treatment for herpes outbreak.  The patient denies any vaginal discharge, vaginal pain, pelvic pain, vaginal itching or known exposures.  She does relate that she has had a herpes outbreak that has been going approximately 1 month.  She has not taken any medication for this.  She denies any fevers, chills, shortness of breath, chest pain, sickle cell crisis like symptoms, recent illnesses.     Past Medical History:  Diagnosis Date   Allergy    seasonal allergies   Eczema    Sickle cell anemia (HCC)    dx at birth   Urinary tract infection     Patient Active Problem List   Diagnosis Date Noted   Hypoxia 09/26/2022   Sickle cell crisis (HCC) 09/21/2022   Sickle cell anemia (HCC) 02/19/2012   Acute chest syndrome (HCC) 02/19/2012   Fever 02/19/2012    History reviewed. No pertinent surgical history.  OB History   No obstetric history on file.      Home Medications    Prior to Admission medications   Medication Sig Start Date End Date Taking? Authorizing Provider  valACYclovir  (VALTREX ) 1000 MG tablet Take 1 tablet (1,000 mg total) by mouth 2 (two) times daily for 10 days. 01/22/24 02/01/24 Yes [employer] Veasey A, PA-C  folic acid  (FOLVITE ) 1 MG tablet Take 1 mg by mouth daily.    [provider]  hydroxyurea  (DROXIA ) 400 MG capsule Take 1,200 mg by mouth daily.    [provider]  norethindrone (MICRONOR) 0.35 MG tablet Take 1 tablet by mouth daily. 05/05/23   [provider]  Vitamin D, Ergocalciferol, (DRISDOL) 1.25 MG (50000 UNIT) CAPS capsule Take 50,000 Units by mouth every 7 (seven) days.    [provider]    Family  History Family History  Problem Relation Age of Onset   Sickle cell trait Mother    Diabetes Mother    Hypertension Mother    Sickle cell trait Father    Diabetes Maternal Grandmother    Hypertension Maternal Grandmother    Hyperlipidemia Maternal Grandmother     Social History Social History   Tobacco Use   Smoking status: Never  Vaping Use   Vaping status: Never Used  Substance Use Topics   Alcohol use: Never   Drug use: Never     Allergies   Patient has no known allergies.   Review of Systems Review of Systems  Constitutional:  Negative for chills and fever.  HENT:  Negative for ear pain and sore throat.   Eyes:  Negative for pain and visual disturbance.  Respiratory:  Negative for cough, chest tightness, shortness of breath and wheezing.   Cardiovascular:  Negative for chest pain and palpitations.  Gastrointestinal:  Negative for abdominal pain and vomiting.  Genitourinary:  Positive for genital sores. Negative for dysuria, hematuria, vaginal bleeding and vaginal discharge.  Musculoskeletal:  Negative for arthralgias, back pain and myalgias.  Skin:  Negative for color change and rash.  Neurological:  Negative for seizures and syncope.  All other systems reviewed and are negative.    Physical Exam Triage Vital Signs ED Triage Vitals  Encounter Vitals Group  BP 01/22/24 1308 108/61     Girls Systolic BP Percentile --      Girls Diastolic BP Percentile --      Boys Systolic BP Percentile --      Boys Diastolic BP Percentile --      Pulse Rate 01/22/24 1308 97     Resp 01/22/24 1308 15     Temp 01/22/24 1308 99.3 F (37.4 C)     Temp Source 01/22/24 1308 Oral     SpO2 01/22/24 1308 (!) 87 %     Weight --      Height --      Head Circumference --      Peak Flow --      Pain Score 01/22/24 1307 0     Pain Loc --      Pain Education --      Exclude from Growth Chart --    No data found.  Updated Vital Signs BP 108/61 (BP Location: Right Arm)    Pulse 97   Temp 99.3 F (37.4 C) (Oral)   Resp 15   LMP 01/19/2024 (Exact Date)   SpO2 (!) 87%   Visual Acuity Right Eye Distance:   Left Eye Distance:   Bilateral Distance:    Right Eye Near:   Left Eye Near:    Bilateral Near:     Physical Exam Vitals and nursing note reviewed.  Constitutional:      General: She is not in acute distress.    Appearance: Normal appearance. She is well-developed. She is not ill-appearing or toxic-appearing.  HENT:     Head: Normocephalic and atraumatic.  Eyes:     Conjunctiva/sclera: Conjunctivae normal.  Cardiovascular:     Rate and Rhythm: Normal rate and regular rhythm.     Heart sounds: No murmur heard. Pulmonary:     Effort: Pulmonary effort is normal. No respiratory distress.     Breath sounds: Normal breath sounds. No wheezing or rhonchi.  Abdominal:     Palpations: Abdomen is soft.     Tenderness: There is no abdominal tenderness.  Musculoskeletal:        General: No swelling.     Cervical back: Neck supple.  Skin:    General: Skin is warm and dry.     Capillary Refill: Capillary refill takes less than 2 seconds.  Neurological:     Mental Status: She is alert.  Psychiatric:        Mood and Affect: Mood normal.      UC Treatments / Results  Labs (all labs ordered are listed, but only abnormal results are displayed) Labs Reviewed  CERVICOVAGINAL ANCILLARY ONLY    EKG   Radiology No results found.  Procedures Procedures (including critical care time)  Medications Ordered in UC Medications - No data to display  Initial Impression / Assessment and Plan / UC Course  I have reviewed the triage vital signs and the nursing notes.  Pertinent labs & imaging results that were available during my care of the patient were reviewed by me and considered in my medical decision making (see chart for details).     Screening examination for STI  Herpes simplex  Hypoxia   Herpes outbreak: Will send in Valtrex  1000 mg  twice daily for 10 days.  Recommend talking to your primary care provider about potentially going on a suppressive dose for recurrent outbreaks.  Screening swab done today and results will be available in 24-48 hours. We will contact you  if we need to arrange additional treatment based on your testing. Negative results will be on your MyChart account. Abstain from sex until you receive your final results.  Use a condom for sexual encounters. If you have any worsening or changing symptoms including abnormal discharge, pelvic pain, abdominal pain, fever, nausea, or vomiting, then you should be reevaluated.   Today in clinic there was a low oxygen level of 88%.  This was double checked and verified.  This is asymptomatic.  Physical exam findings are reassuring.  Recommend contacting your sickle cell provider today to notify them of this abnormality.  If you do develop symptoms including shortness of breath, chest pain, severe fatigue or signs of a sickle cell crisis then recommend going to the emergency room immediately for further evaluation  Final Clinical Impressions(s) / UC Diagnoses   Final diagnoses:  Screening examination for STI  Herpes simplex  Hypoxia     Discharge Instructions      Herpes outbreak: Will send in Valtrex  1000 mg twice daily for 10 days.  Recommend talking to your primary care provider about potentially going on a suppressive dose for recurrent outbreaks.  Screening swab done today and results will be available in 24-48 hours. We will contact you if we need to arrange additional treatment based on your testing. Negative results will be on your MyChart account. Abstain from sex until you receive your final results.  Use a condom for sexual encounters. If you have any worsening or changing symptoms including abnormal discharge, pelvic pain, abdominal pain, fever, nausea, or vomiting, then you should be reevaluated.   Today in clinic there was a low oxygen level of 88%.  This  was double checked and verified.  This is asymptomatic.  Physical exam findings are reassuring.  Recommend contacting your sickle cell provider today to notify them of this abnormality.  If you do develop symptoms including shortness of breath, chest pain, severe fatigue or signs of a sickle cell crisis then recommend going to the emergency room immediately for further evaluation     ED Prescriptions     Medication Sig Dispense Auth. Provider   valACYclovir  (VALTREX ) 1000 MG tablet Take 1 tablet (1,000 mg total) by mouth 2 (two) times daily for 10 days. 20 tablet Teresa Almarie LABOR, [place]      PDMP not reviewed this encounter.   Teresa Almarie LABOR, [place] 01/22/24 1347

## 2024-01-23 LAB — CERVICOVAGINAL ANCILLARY ONLY
Chlamydia: NEGATIVE
Comment: NEGATIVE
Comment: NEGATIVE
Comment: NORMAL
Neisseria Gonorrhea: NEGATIVE
Trichomonas: NEGATIVE

## 2024-02-13 ENCOUNTER — Ambulatory Visit
Admission: EM | Admit: 2024-02-13 | Discharge: 2024-02-13 | Disposition: A | Discharge status: Home / Self Care | Attending: Urgent Care | Admitting: Urgent Care

## 2024-02-13 DIAGNOSIS — J988 Other specified respiratory disorders: Secondary | ICD-10-CM | POA: Diagnosis present

## 2024-02-13 DIAGNOSIS — Z113 Encounter for screening for infections with a predominantly sexual mode of transmission: Secondary | ICD-10-CM | POA: Insufficient documentation

## 2024-02-13 DIAGNOSIS — Z202 Contact with and (suspected) exposure to infections with a predominantly sexual mode of transmission: Secondary | ICD-10-CM | POA: Insufficient documentation

## 2024-02-13 DIAGNOSIS — B9789 Other viral agents as the cause of diseases classified elsewhere: Secondary | ICD-10-CM | POA: Insufficient documentation

## 2024-02-13 MED ORDER — PREDNISONE 10 MG PO TABS: 30.0000 mg | ORAL_TABLET | Freq: Every day | ORAL | 0 refills | Status: DC

## 2024-02-13 MED ORDER — CETIRIZINE HCL 10 MG PO TABS: 10.0000 mg | ORAL_TABLET | Freq: Every day | ORAL | 0 refills | Status: AC

## 2024-02-13 MED ORDER — PROMETHAZINE-DM 6.25-15 MG/5ML PO SYRP: 5.0000 mL | ORAL_SOLUTION | Freq: Three times a day (TID) | ORAL | 0 refills | Status: AC | PRN

## 2024-02-13 NOTE — ED Triage Notes (Signed)
 Pt reports cough and congestion x 5 days. OTC cough meds gives no relief.

## 2024-02-13 NOTE — Discharge Instructions (Addendum)
 We will manage this as a viral respiratory illness. For sore throat or cough try using a honey-based tea. Use 3 teaspoons of honey with juice squeezed from half lemon. Place shaved pieces of ginger into 1/2-1 cup of water and warm over stove top. Then mix the ingredients and repeat every 4 hours as needed. Please take Tylenol  500mg -650mg  once every 6 hours for fevers, aches and pains. Hydrate very well with at least 2 liters (64 ounces) of water. Eat light meals such as soups (chicken and noodles, chicken wild rice, vegetable).  Do not eat any foods that you are allergic to.  Start an antihistamine like Zyrtec  (10mg  daily) for postnasal drainage, sinus congestion.  You can take this together with prednisone  for your chest congestion and respiratory symptoms.  Use cough syrup as needed.   Will let you know about your test results for your blood work tomorrow.  The results for the vaginal swab will result on Monday.

## 2024-02-13 NOTE — ED Provider Notes (Signed)
 [facility]  Note:  This document was prepared using Conservation officer, historic buildings and may include unintentional dictation errors.  MRN: [id number] DOB: November 04, 2005  Subjective:   Jessica Macias is a 18 y.o. female presenting for 5-day history of sinus congestion, drainage, productive cough, chest congestion.  Has used Mucinex  and over-the-counter cough medication with minimal relief.  Has a history of acute chest syndrome, sickle cell anemia but does not feel that this is the source of her symptoms.  No chest pain, shortness of breath, wheezing.  No asthma.  Has allergies but does not take anything consistently for this.  Would also like complete STI testing.  Just got treated for a yeast infection and just wants to make sure she does not have an STI.  No direct known exposures.  Denies fever, n/v, abdominal pain, pelvic pain, rashes, dysuria, urinary frequency, hematuria, vaginal discharge.  No concern for pregnancy per patient.  Takes hydroxyurea , folic acid  and vitamin D .    No Known Allergies  Past Medical History:  Diagnosis Date   Allergy    seasonal allergies   Eczema    Sickle cell anemia (HCC)    dx at birth   Urinary tract infection      No past surgical history on file.  Family History  Problem Relation Age of Onset   Sickle cell trait Mother    Diabetes Mother    Hypertension Mother    Sickle cell trait Father    Diabetes Maternal Grandmother    Hypertension Maternal Grandmother    Hyperlipidemia Maternal Grandmother     Social History   Tobacco Use   Smoking status: Never  Vaping Use   Vaping status: Never Used  Substance Use Topics   Alcohol use: Never   Drug use: Never    ROS   Objective:   Vitals: BP 127/67 (BP Location: Left Arm)   Pulse (!) 102   Temp 98.3 F (36.8 C) (Oral)   Resp 17   LMP 01/12/2024 (Approximate)   SpO2 95%   Physical Exam Constitutional:      General: She is not in acute distress.     Appearance: Normal appearance. She is well-developed and normal weight. She is not ill-appearing, toxic-appearing or diaphoretic.  HENT:     Head: Normocephalic and atraumatic.     Right Ear: Tympanic membrane, ear canal and external ear normal. No drainage or tenderness. No middle ear effusion. There is no impacted cerumen. Tympanic membrane is not erythematous or bulging.     Left Ear: Tympanic membrane, ear canal and external ear normal. No drainage or tenderness.  No middle ear effusion. There is no impacted cerumen. Tympanic membrane is not erythematous or bulging.     Nose: Congestion present. No rhinorrhea.     Mouth/Throat:     Mouth: Mucous membranes are moist. No oral lesions.     Pharynx: No pharyngeal swelling, oropharyngeal exudate, posterior oropharyngeal erythema or uvula swelling.     Tonsils: No tonsillar exudate or tonsillar abscesses.     Comments: Thick streaks of postnasal drainage overlying pharynx.  Eyes:     General: No scleral icterus.       Right eye: No discharge.        Left eye: No discharge.     Extraocular Movements: Extraocular movements intact.     Right eye: Normal extraocular motion.     Left eye: Normal extraocular motion.     Conjunctiva/sclera: Conjunctivae normal.  Cardiovascular:     Rate and Rhythm: Normal rate and regular rhythm.     Heart sounds: Normal heart sounds. No murmur heard.    No friction rub. No gallop.  Pulmonary:     Effort: Pulmonary effort is normal. No respiratory distress.     Breath sounds: No stridor. No wheezing, rhonchi or rales.  Chest:     Chest wall: No tenderness.  Abdominal:     General: Bowel sounds are normal. There is no distension.     Palpations: Abdomen is soft. There is no mass.     Tenderness: There is no abdominal tenderness. There is no right CVA tenderness, left CVA tenderness, guarding or rebound.  Musculoskeletal:     Cervical back: Normal range of motion and neck supple.  Lymphadenopathy:      Cervical: No cervical adenopathy.  Skin:    General: Skin is warm and dry.  Neurological:     General: No focal deficit present.     Mental Status: She is alert and oriented to person, place, and time.  Psychiatric:        Mood and Affect: Mood normal.        Behavior: Behavior normal.        Thought Content: Thought content normal.        Judgment: Judgment normal.     Assessment and Plan :   PDMP not reviewed this encounter.  1. Viral respiratory infection   2. Possible exposure to STD   3. Screen for STD (sexually transmitted disease)    Patient has significant respiratory symptoms and therefore offered a lower prednisone  course at 30 mg to help as a respiratory boost.  Discussed antibiotic stewardship and patient was agreeable.  Use supportive care otherwise for oral respiratory infection.  STI check pending, will treat as appropriate based off of results.  Counseled patient on potential for adverse effects with medications prescribed/recommended today, ER and return-to-clinic precautions discussed, patient verbalized understanding.    Christopher Savannah, [place] 02/13/24 [id number]

## 2024-02-14 LAB — HIV ANTIBODY (ROUTINE TESTING W REFLEX): HIV Screen 4th Generation wRfx: NONREACTIVE

## 2024-02-14 LAB — SYPHILIS: RPR W/REFLEX TO RPR TITER AND TREPONEMAL ANTIBODIES, TRADITIONAL SCREENING AND DIAGNOSIS ALGORITHM: RPR Ser Ql: NONREACTIVE

## 2024-02-16 ENCOUNTER — Encounter (HOSPITAL_COMMUNITY): Payer: Self-pay

## 2024-02-16 ENCOUNTER — Emergency Department (HOSPITAL_COMMUNITY)

## 2024-02-16 ENCOUNTER — Inpatient Hospital Stay (HOSPITAL_COMMUNITY)
Admission: EM | Admit: 2024-02-16 | Discharge: 2024-02-22 | DRG: 831 | Disposition: A | Discharge status: Home / Self Care | Attending: Internal Medicine | Admitting: Internal Medicine

## 2024-02-16 ENCOUNTER — Other Ambulatory Visit: Payer: Self-pay

## 2024-02-16 DIAGNOSIS — D571 Sickle-cell disease without crisis: Secondary | ICD-10-CM | POA: Diagnosis present

## 2024-02-16 DIAGNOSIS — O99011 Anemia complicating pregnancy, first trimester: Secondary | ICD-10-CM | POA: Diagnosis present

## 2024-02-16 DIAGNOSIS — O99111 Other diseases of the blood and blood-forming organs and certain disorders involving the immune mechanism complicating pregnancy, first trimester: Principal | ICD-10-CM | POA: Diagnosis present

## 2024-02-16 DIAGNOSIS — Z832 Family history of diseases of the blood and blood-forming organs and certain disorders involving the immune mechanism: Secondary | ICD-10-CM

## 2024-02-16 DIAGNOSIS — Z3201 Encounter for pregnancy test, result positive: Secondary | ICD-10-CM | POA: Diagnosis present

## 2024-02-16 DIAGNOSIS — Z1152 Encounter for screening for COVID-19: Secondary | ICD-10-CM

## 2024-02-16 DIAGNOSIS — D72829 Elevated white blood cell count, unspecified: Secondary | ICD-10-CM | POA: Diagnosis present

## 2024-02-16 DIAGNOSIS — D638 Anemia in other chronic diseases classified elsewhere: Secondary | ICD-10-CM | POA: Diagnosis present

## 2024-02-16 DIAGNOSIS — G894 Chronic pain syndrome: Secondary | ICD-10-CM | POA: Diagnosis present

## 2024-02-16 DIAGNOSIS — Z8249 Family history of ischemic heart disease and other diseases of the circulatory system: Secondary | ICD-10-CM

## 2024-02-16 DIAGNOSIS — Z833 Family history of diabetes mellitus: Secondary | ICD-10-CM

## 2024-02-16 DIAGNOSIS — Z3490 Encounter for supervision of normal pregnancy, unspecified, unspecified trimester: Secondary | ICD-10-CM

## 2024-02-16 DIAGNOSIS — Z83438 Family history of other disorder of lipoprotein metabolism and other lipidemia: Secondary | ICD-10-CM

## 2024-02-16 DIAGNOSIS — D5700 Hb-SS disease with crisis, unspecified: Principal | ICD-10-CM | POA: Diagnosis present

## 2024-02-16 LAB — CBC WITH DIFFERENTIAL/PLATELET
Abs Immature Granulocytes: 2.38 K/uL — ABNORMAL HIGH (ref 0.00–0.07)
Basophils Absolute: 0.2 K/uL — ABNORMAL HIGH (ref 0.0–0.1)
Basophils Relative: 1 %
Eosinophils Absolute: 0 K/uL (ref 0.0–0.5)
Eosinophils Relative: 0 %
HCT: 21.7 % — ABNORMAL LOW (ref 36.0–46.0)
Hemoglobin: 7.7 g/dL — ABNORMAL LOW (ref 12.0–15.0)
Immature Granulocytes: 8 %
Lymphocytes Relative: 8 %
Lymphs Abs: 2.2 K/uL (ref 0.7–4.0)
MCH: 32.5 pg (ref 26.0–34.0)
MCHC: 35.5 g/dL (ref 30.0–36.0)
MCV: 91.6 fL (ref 80.0–100.0)
Monocytes Absolute: 2.4 K/uL — ABNORMAL HIGH (ref 0.1–1.0)
Monocytes Relative: 8 %
Neutro Abs: 22.1 K/uL — ABNORMAL HIGH (ref 1.7–7.7)
Neutrophils Relative %: 75 %
Platelets: 514 K/uL — ABNORMAL HIGH (ref 150–400)
RBC: 2.37 MIL/uL — ABNORMAL LOW (ref 3.87–5.11)
RDW: 23.9 % — ABNORMAL HIGH (ref 11.5–15.5)
Smear Review: NORMAL
WBC: 29.3 K/uL — ABNORMAL HIGH (ref 4.0–10.5)
nRBC: 6.7 % — ABNORMAL HIGH (ref 0.0–0.2)

## 2024-02-16 LAB — URINALYSIS, ROUTINE W REFLEX MICROSCOPIC
Bilirubin Urine: NEGATIVE
Glucose, UA: NEGATIVE mg/dL
Hgb urine dipstick: NEGATIVE
Ketones, ur: NEGATIVE mg/dL
Leukocytes,Ua: NEGATIVE
Nitrite: NEGATIVE
Protein, ur: NEGATIVE mg/dL
Specific Gravity, Urine: 1.011 (ref 1.005–1.030)
pH: 6 (ref 5.0–8.0)

## 2024-02-16 LAB — RESP PANEL BY RT-PCR (RSV, FLU A&B, COVID)  RVPGX2
Influenza A by PCR: NEGATIVE
Influenza B by PCR: NEGATIVE
Resp Syncytial Virus by PCR: NEGATIVE
SARS Coronavirus 2 by RT PCR: NEGATIVE

## 2024-02-16 LAB — CERVICOVAGINAL ANCILLARY ONLY
Chlamydia: NEGATIVE
Comment: NEGATIVE
Comment: NEGATIVE
Comment: NORMAL
Neisseria Gonorrhea: NEGATIVE
Trichomonas: NEGATIVE

## 2024-02-16 LAB — BASIC METABOLIC PANEL WITH GFR
Anion gap: 14 (ref 5–15)
BUN: 7 mg/dL (ref 6–20)
CO2: 21 mmol/L — ABNORMAL LOW (ref 22–32)
Calcium: 8.9 mg/dL (ref 8.9–10.3)
Chloride: 103 mmol/L (ref 98–111)
Creatinine, Ser: 0.56 mg/dL (ref 0.44–1.00)
GFR, Estimated: >60 mL/min (ref >60)
Glucose, Bld: 128 mg/dL — ABNORMAL HIGH (ref 70–99)
Potassium: 4 mmol/L (ref 3.5–5.1)
Sodium: 137 mmol/L (ref 135–145)

## 2024-02-16 LAB — HCG, SERUM, QUALITATIVE: Preg, Serum: POSITIVE — AB

## 2024-02-16 LAB — RETICULOCYTES
Immature Retic Fract: 31.2 % — ABNORMAL HIGH (ref 2.3–15.9)
RBC.: 2.36 MIL/uL — ABNORMAL LOW (ref 3.87–5.11)
Retic Count, Absolute: 516 K/uL — ABNORMAL HIGH (ref 19.0–186.0)
Retic Ct Pct: 21.9 % — ABNORMAL HIGH (ref 0.4–3.1)

## 2024-02-16 LAB — HCG, QUANTITATIVE, PREGNANCY: hCG, Beta Chain, Quant, S: 113 m[IU]/mL — ABNORMAL HIGH (ref <5)

## 2024-02-16 MED ORDER — NALOXONE HCL 0.4 MG/ML IJ SOLN: 0.4000 mg | INTRAMUSCULAR | Status: DC | PRN

## 2024-02-16 MED ORDER — HYDROMORPHONE 1 MG/ML IV SOLN
INTRAVENOUS | Status: DC
  Administered 2024-02-16: 30 mg via INTRAVENOUS
  Administered 2024-02-16 – 2024-02-17 (×2): 2.1 mg via INTRAVENOUS
  Administered 2024-02-17: 1.8 mg via INTRAVENOUS
  Administered 2024-02-17: 2.3 mg via INTRAVENOUS
  Administered 2024-02-17: 1.2 mg via INTRAVENOUS
  Administered 2024-02-17: 2.7 mg via INTRAVENOUS
  Administered 2024-02-17: 2.1 mg via INTRAVENOUS
  Administered 2024-02-18: 1.8 mg via INTRAVENOUS
  Administered 2024-02-18: 2.1 mg via INTRAVENOUS
  Administered 2024-02-18: 2.5 mg via INTRAVENOUS
  Administered 2024-02-18: 0.3 mg via INTRAVENOUS
  Administered 2024-02-18: 1.8 mg via INTRAVENOUS
  Administered 2024-02-18: 30 mg via INTRAVENOUS
  Administered 2024-02-18: 1.2 mg via INTRAVENOUS
  Administered 2024-02-18: 1.8 mg via INTRAVENOUS
  Administered 2024-02-19: 3 mg via INTRAVENOUS
  Administered 2024-02-19: 0.3 mg via INTRAVENOUS
  Administered 2024-02-19: 0.9 mg via INTRAVENOUS
  Administered 2024-02-19: 1.5 mg via INTRAVENOUS
  Administered 2024-02-19: 2.4 mg via INTRAVENOUS
  Administered 2024-02-20: 3.3 mg via INTRAVENOUS
  Administered 2024-02-20: 0.3 mg via INTRAVENOUS
  Administered 2024-02-20: 2.4 mg via INTRAVENOUS
  Administered 2024-02-20: 1.5 mg via INTRAVENOUS
  Administered 2024-02-20: 2.1 mg via INTRAVENOUS
  Administered 2024-02-21: 1.8 mg via INTRAVENOUS
  Administered 2024-02-21: 0.01 mg via INTRAVENOUS
  Filled 2024-02-16 (×2): qty 30

## 2024-02-16 MED ORDER — HYDROMORPHONE HCL 1 MG/ML IJ SOLN
1.0000 mg | INTRAMUSCULAR | Status: DC | PRN
  Administered 2024-02-16: 1 mg via INTRAVENOUS
  Filled 2024-02-16: qty 1

## 2024-02-16 MED ORDER — SENNOSIDES-DOCUSATE SODIUM 8.6-50 MG PO TABS
1.0000 | ORAL_TABLET | Freq: Two times a day (BID) | ORAL | Status: DC
  Administered 2024-02-16 – 2024-02-21 (×12): 1 via ORAL
  Filled 2024-02-16 (×12): qty 1

## 2024-02-16 MED ORDER — SODIUM CHLORIDE 0.45 % IV SOLN: INTRAVENOUS | Status: DC

## 2024-02-16 MED ORDER — PREDNISONE 5 MG PO TABS
30.0000 mg | ORAL_TABLET | Freq: Every day | ORAL | Status: DC
Start: 2024-02-17 — End: 2024-02-16

## 2024-02-16 MED ORDER — HYDROMORPHONE HCL 1 MG/ML IJ SOLN: 0.5000 mg | Freq: Once | INTRAMUSCULAR | Status: DC

## 2024-02-16 MED ORDER — OXYCODONE HCL 5 MG PO TABS
10.0000 mg | ORAL_TABLET | Freq: Once | ORAL | Status: AC
Start: 2024-02-16 — End: 2024-02-16
  Administered 2024-02-16: 10 mg via ORAL
  Filled 2024-02-16: qty 2

## 2024-02-16 MED ORDER — DIPHENHYDRAMINE HCL 25 MG PO CAPS
25.0000 mg | ORAL_CAPSULE | ORAL | Status: DC | PRN
  Administered 2024-02-16 – 2024-02-20 (×9): 25 mg via ORAL
  Filled 2024-02-16 (×9): qty 1

## 2024-02-16 MED ORDER — FOLIC ACID 1 MG PO TABS
1.0000 mg | ORAL_TABLET | Freq: Every day | ORAL | Status: DC
  Administered 2024-02-16 – 2024-02-17 (×2): 1 mg via ORAL
  Filled 2024-02-16 (×2): qty 1

## 2024-02-16 MED ORDER — POLYETHYLENE GLYCOL 3350 17 G PO PACK
17.0000 g | PACK | Freq: Every day | ORAL | Status: DC | PRN
  Administered 2024-02-19: 17 g via ORAL
  Filled 2024-02-16: qty 1

## 2024-02-16 MED ORDER — VITAMIN D (ERGOCALCIFEROL) 1.25 MG (50000 UNIT) PO CAPS
50000.0000 [IU] | ORAL_CAPSULE | ORAL | Status: DC
  Administered 2024-02-18: 50000 [IU] via ORAL
  Filled 2024-02-16: qty 1

## 2024-02-16 MED ORDER — SODIUM CHLORIDE 0.9% FLUSH: 9.0000 mL | INTRAVENOUS | Status: DC | PRN

## 2024-02-16 MED ORDER — HYDROXYUREA 300 MG PO CAPS: 1200.0000 mg | ORAL_CAPSULE | Freq: Every day | ORAL | Status: DC

## 2024-02-16 MED ORDER — ACETAMINOPHEN 500 MG PO TABS
1000.0000 mg | ORAL_TABLET | Freq: Four times a day (QID) | ORAL | Status: DC | PRN
  Administered 2024-02-16 – 2024-02-20 (×10): 1000 mg via ORAL
  Filled 2024-02-16 (×10): qty 2

## 2024-02-16 MED ORDER — HYDROMORPHONE HCL 1 MG/ML IJ SOLN
0.5000 mg | Freq: Once | INTRAMUSCULAR | Status: AC
  Administered 2024-02-16: 0.5 mg via INTRAVENOUS
  Filled 2024-02-16: qty 1

## 2024-02-16 MED ORDER — ONDANSETRON HCL 4 MG/2ML IJ SOLN
4.0000 mg | Freq: Four times a day (QID) | INTRAMUSCULAR | Status: DC | PRN
  Administered 2024-02-16: 4 mg via INTRAVENOUS
  Filled 2024-02-16: qty 2

## 2024-02-16 NOTE — ED Notes (Signed)
 Pt transferred to 1608, resting comfortably.  Aware of plan of care

## 2024-02-16 NOTE — ED Provider Notes (Signed)
 [name] Provider Note   CSN: [id number] Arrival date & time: 02/16/24  9076     Patient presents with: Sickle Cell Pain Crisis   Jessica Macias is a 18 y.o. female.   18 year old with prior medical history as detailed below presents for evaluation.  Patient complains of sickle cell pain crisis.  Patient complains of pain all over.  She reports that her pain is typical of her sickle cell pain crises.  She denies fever.  She reports the pain began around midnight last night.  She typically will take a 5 mg tablet of oxycodone .  She does not have any.  Therefore the ibuprofen  that she took did not help her pain.  She denies nausea or vomiting.  She reports chest discomfort.  She reports that this is typical when she has a sickle pain crisis.  She also reports mild congestion and a runny nose over the last 2 or 3 days.  She denies fever.  The history is provided by the patient and medical records.       Prior to Admission medications   Medication Sig Start Date End Date Taking? Authorizing Provider  cetirizine  (ZYRTEC  ALLERGY) 10 MG tablet Take 1 tablet (10 mg total) by mouth daily. 02/13/24   Christopher Savannah, PA-C  folic acid  (FOLVITE ) 1 MG tablet Take 1 mg by mouth daily.    [provider]  hydroxyurea  (DROXIA ) 400 MG capsule Take 1,200 mg by mouth daily.    [provider]  norethindrone (MICRONOR) 0.35 MG tablet Take 1 tablet by mouth daily. 05/05/23   [provider]  predniSONE  (DELTASONE ) 10 MG tablet Take 3 tablets (30 mg total) by mouth daily with breakfast. 02/13/24   Christopher Savannah, PA-C  promethazine -dextromethorphan  (PROMETHAZINE -DM) 6.25-15 MG/5ML syrup Take 5 mLs by mouth 3 (three) times daily as needed for cough. 02/13/24   Christopher Savannah, PA-C  Vitamin D , Ergocalciferol , (DRISDOL ) 1.25 MG (50000 UNIT) CAPS capsule Take 50,000 Units by mouth every 7 (seven) days.    [provider]    Allergies:  Patient has no known allergies.    Review of Systems  All other systems reviewed and are negative.   Updated Vital Signs Ht 5' 9 (1.753 m)   Wt 65.8 kg   LMP 01/12/2024 (Approximate)   BMI 21.42 kg/m   Physical Exam Vitals and nursing note reviewed.  Constitutional:      General: She is not in acute distress.    Appearance: She is well-developed.  HENT:     Head: Normocephalic and atraumatic.  Eyes:     Conjunctiva/sclera: Conjunctivae normal.  Cardiovascular:     Rate and Rhythm: Normal rate and regular rhythm.     Heart sounds: No murmur heard. Pulmonary:     Effort: Pulmonary effort is normal. No respiratory distress.     Breath sounds: Normal breath sounds.  Abdominal:     Palpations: Abdomen is soft.     Tenderness: There is no abdominal tenderness.  Musculoskeletal:        General: No swelling.     Cervical back: Neck supple.  Skin:    General: Skin is warm and dry.     Capillary Refill: Capillary refill takes less than 2 seconds.  Neurological:     Mental Status: She is alert.  Psychiatric:        Mood and Affect: Mood normal.     (all labs ordered are listed, but only abnormal results  are displayed) Labs Reviewed  BASIC METABOLIC PANEL WITH GFR - Abnormal; Notable for the following components:      Result Value   CO2 21 (*)    Glucose, Bld 128 (*)    All other components within normal limits  CBC WITH DIFFERENTIAL/PLATELET - Abnormal; Notable for the following components:   WBC 29.3 (*)    RBC 2.37 (*)    Hemoglobin 7.7 (*)    HCT 21.7 (*)    RDW 23.9 (*)    Platelets 514 (*)    nRBC 6.7 (*)    Neutro Abs 22.1 (*)    Monocytes Absolute 2.4 (*)    Basophils Absolute 0.2 (*)    Abs Immature Granulocytes 2.38 (*)    All other components within normal limits  RETICULOCYTES - Abnormal; Notable for the following components:   Retic Ct Pct 21.9 (*)    RBC. 2.36 (*)    Retic Count, Absolute 516.0 (*)    Immature Retic Fract 31.2 (*)    All other  components within normal limits  HCG, SERUM, QUALITATIVE - Abnormal; Notable for the following components:   Preg, Serum POSITIVE (*)    All other components within normal limits  HCG, QUANTITATIVE, PREGNANCY - Abnormal; Notable for the following components:   hCG, Beta Chain, Quant, S 113 (*)    All other components within normal limits  RESP PANEL BY RT-PCR (RSV, FLU A&B, COVID)  RVPGX2    EKG: None  Radiology: No results found.   Procedures   Medications Ordered in the ED  oxyCODONE  (Oxy IR/ROXICODONE ) immediate release tablet 10 mg (has no administration in time range)  0.45 % sodium chloride  infusion (has no administration in time range)                                    Medical Decision Making Patient presents with typical pain for her sickle cell crises.  Patient without fever.  Chest x-ray without acute abnormality.  Despite use of p.o. and IV pain medication her pain is not controlled.  Patient's labs appear to be close to baseline.  hCG is elevated at 113.  Patient's last period was last week per her report.  She denies lower abdominal pain, vaginal bleeding, vaginal discharge.  Given patient's continued pain, sickle cell team made aware of case and will evaluate for admission.  Amount and/or Complexity of Data Reviewed Labs: ordered. Radiology: ordered.  Risk Prescription drug management. Decision regarding hospitalization.        Final diagnoses:  Sickle cell pain crisis [facility])    ED Discharge Orders     None          Laurice Maude BROCKS, MD 02/16/24 1349

## 2024-02-16 NOTE — Telephone Encounter (Signed)
 Direct call to triage.    Jessica Macias called to request a prescription for Oxycodone . She believes she is in crisis, and the ibuprofen  she has been taking is not offering adequate relief.   Secure chat sent to S. Waddle, PA-C.  Jessica Macias was sick within the last week (cough, mucous), but she feels better now. She stated she has not had any fevers, while sick or otherwise. She denied being around other sick individuals.  She is experiencing 7/10 pain in her chest, back, and legs. She is also experiencing some shortness of breath.  She has not had a prescription for Oxycodone  since July, 2025, and that was from an ER visit. No recent prescriptions for pain medication from our providers.   Per S. Waddle, PA-C:  In the case of a recent URI and audible shortness of breath over the phone and some chest pain as well as pain in her legs and back the ER is going to be a good place for evaluation to make sure no signed of pneumonia or acute chest syndrome   Jessica Macias was agreeable to ER. She will go this morning.,    Patient was instructed to call clinic with further needs or questions.

## 2024-02-16 NOTE — Plan of Care (Signed)
  Problem: Education: Goal: Awareness of infection prevention will improve Outcome: Progressing   Problem: Respiratory: Goal: Pulmonary complications will be avoided or minimized Outcome: Progressing   Problem: Fluid Volume: Goal: Ability to maintain a balanced intake and output will improve Outcome: Progressing   Problem: Education: Goal: Knowledge of General Education information will improve Description: Including pain rating scale, medication(s)/side effects and non-pharmacologic comfort measures Outcome: Progressing   Problem: Health Behavior/Discharge Planning: Goal: Ability to manage health-related needs will improve Outcome: Progressing   Problem: Clinical Measurements: Goal: Will remain free from infection Outcome: Progressing Goal: Respiratory complications will improve Outcome: Progressing

## 2024-02-16 NOTE — ED Triage Notes (Addendum)
 Sickle cell patient arrived POV C/O severe pain all over that started last nigth; took ibuprofen  with no relief.

## 2024-02-16 NOTE — ED Notes (Signed)
 Pt moaning, I hurt all over.  MD messaged.  Pt given warm packs for lower back and warm blanket.  Reassured.

## 2024-02-16 NOTE — H&P (Signed)
 H&P  Patient Demographics:  Jessica Macias, is a 18 y.o. female  MRN: [id number]   DOB - 2005/08/12  Admit Date - 02/16/2024  Outpatient Primary MD for the patient is Jolee Madelin Patch, MD  Chief Complaint  Patient presents with   Sickle Cell Pain Crisis      HPI:   Jessica Macias  is a 18 y.o. female, with a medical history significant for sickle cell SS disease, chronic sickle cell anemia, chronic leukocytosis, came with sudden onset of generalize body pain. Symptoms started on last night around midnight, patient woke up with severe pain involving bilateral shoulders, back, chest, and bilateral legs.  Chest discomfort has been sharp bilaterally, worsening with deep breathing.  Denies any fever, chills, cough. NO urinary symptoms.    ED Course:  Temp 98.9, pulse 96, Resp 20, BP 118/70, Sp02 94%  Preg, Serum POSITIVE, Retic Count, Absolute 516.0, WBC 29.3, HGB 7.7 Patient presented to the ED with uncontrolled pain, she was treated with IVF, IV pain medication with no resolution to pain symptoms. Patient is being admitted for ongoing sickle cell pain crisis.    Review of systems:  In addition to the HPI above, patient reports No fever or chills No Headache, No changes with vision or hearing No problems swallowing food or liquids No chest pain, cough or shortness of breath No abdominal pain, No nausea or vomiting, Bowel movements are regular No blood in stool or urine No dysuria No new skin rashes or bruises No new joints pains-aches No new weakness, tingling, numbness in any extremity No recent weight gain or loss No polyuria, polydypsia or polyphagia No significant Mental Stressors  A full 10 point Review of Systems was done, except as stated above, all other Review of Systems were negative.  With Past History of the following :   Past Medical History:  Diagnosis Date   Allergy    seasonal allergies   Eczema    Sickle cell anemia (HCC)    dx at birth   Sickle cell  anemia (HCC)    Urinary tract infection       History reviewed. No pertinent surgical history.   Social History:   Social History   Tobacco Use   Smoking status: Never   Smokeless tobacco: Not on file  Substance Use Topics   Alcohol use: Never     Lives - At home   Family History :   Family History  Problem Relation Age of Onset   Sickle cell trait Mother    Diabetes Mother    Hypertension Mother    Sickle cell trait Father    Diabetes Maternal Grandmother    Hypertension Maternal Grandmother    Hyperlipidemia Maternal Grandmother      Home Medications:   Prior to Admission medications   Medication Sig Start Date End Date Taking? Authorizing Provider  cetirizine  (ZYRTEC  ALLERGY) 10 MG tablet Take 1 tablet (10 mg total) by mouth daily. 02/13/24   Christopher Savannah, PA-C  folic acid  (FOLVITE ) 1 MG tablet Take 1 mg by mouth daily.    [provider]  hydroxyurea  (DROXIA ) 400 MG capsule Take 1,200 mg by mouth daily.    [provider]  norethindrone (MICRONOR) 0.35 MG tablet Take 1 tablet by mouth daily. 05/05/23   [provider]  predniSONE  (DELTASONE ) 10 MG tablet Take 3 tablets (30 mg total) by mouth daily with breakfast. 02/13/24   Christopher Savannah, PA-C  promethazine -dextromethorphan  (PROMETHAZINE -DM) 6.25-15 MG/5ML syrup Take 5 mLs  by mouth 3 (three) times daily as needed for cough. 02/13/24   Christopher Savannah, PA-C  Vitamin D , Ergocalciferol , (DRISDOL ) 1.25 MG (50000 UNIT) CAPS capsule Take 50,000 Units by mouth every 7 (seven) days.    [provider]     Allergies:   No Known Allergies   Physical Exam:   Vitals:   Vitals:   02/16/24 1308 02/16/24 1548  BP: 118/63 118/70  Pulse: 89 96  Resp: (!) 24 20  Temp: 98.2 F (36.8 C) 98.9 F (37.2 C)  SpO2: 91% 94%    Physical Exam: Constitutional: Patient appears well-developed and well-nourished. Not in obvious distress. HENT: Normocephalic, atraumatic, External right and left ear  normal. Oropharynx is clear and moist.  Eyes: Conjunctivae and EOM are normal. PERRLA, no scleral icterus. Neck: Normal ROM. Neck supple. No JVD. No tracheal deviation. No thyromegaly. CVS: RRR, S1/S2 +, no murmurs, no gallops, no carotid bruit.  Pulmonary: Effort and breath sounds normal, no stridor, rhonchi, wheezes, rales.  Abdominal: Soft. BS +, no distension, tenderness, rebound or guarding.  Musculoskeletal: Normal range of motion. No edema and no tenderness.  Lymphadenopathy: No lymphadenopathy noted, cervical, inguinal or axillary Neuro: Alert. Normal reflexes, muscle tone coordination. No cranial nerve deficit. Skin: Skin is warm and dry. No rash noted. Not diaphoretic. No erythema. No pallor. Psychiatric: Normal mood and affect. Behavior, judgment, thought content normal.   Data Review:   CBC Recent Labs  Lab 02/16/24 0953  WBC 29.3*  HGB 7.7*  HCT 21.7*  PLT 514*  MCV 91.6  MCH 32.5  MCHC 35.5  RDW 23.9*  LYMPHSABS 2.2  MONOABS 2.4*  EOSABS 0.0  BASOSABS 0.2*   ------------------------------------------------------------------------------------------------------------------  Chemistries  Recent Labs  Lab 02/16/24 0953  NA 137  K 4.0  CL 103  CO2 21*  GLUCOSE 128*  BUN 7  CREATININE 0.56  CALCIUM 8.9   ------------------------------------------------------------------------------------------------------------------ estimated creatinine clearance is 116.5 mL/min (by C-G formula based on SCr of 0.56 mg/dL). ------------------------------------------------------------------------------------------------------------------ No results for input(s): TSH, T4TOTAL, T3FREE, THYROIDAB in the last 72 hours.  Invalid input(s): FREET3  Coagulation profile No results for input(s): INR, PROTIME in the last 168 hours. ------------------------------------------------------------------------------------------------------------------- No results for  input(s): DDIMER in the last 72 hours. -------------------------------------------------------------------------------------------------------------------  Cardiac Enzymes No results for input(s): CKMB, TROPONINI, MYOGLOBIN in the last 168 hours.  Invalid input(s): CK ------------------------------------------------------------------------------------------------------------------ No results found for: BNP  ---------------------------------------------------------------------------------------------------------------  Urinalysis    Component Value Date/Time   COLORURINE AMBER (A) 09/21/2022 0446   APPEARANCEUR CLEAR 09/21/2022 0446   LABSPEC 1.010 09/21/2022 0446   PHURINE 6.0 09/21/2022 0446   GLUCOSEU NEGATIVE 09/21/2022 0446   HGBUR NEGATIVE 09/21/2022 0446   BILIRUBINUR negative 11/15/2023 1137   KETONESUR negative 11/15/2023 1137   KETONESUR NEGATIVE 09/21/2022 0446   PROTEINUR negative 11/15/2023 1137   PROTEINUR NEGATIVE 09/21/2022 0446   UROBILINOGEN >=8.0 (A) 11/15/2023 1137   UROBILINOGEN 1.0 12/27/2008 2203   NITRITE Negative 11/15/2023 1137   NITRITE NEGATIVE 09/21/2022 0446   LEUKOCYTESUR Negative 11/15/2023 1137   LEUKOCYTESUR NEGATIVE 09/21/2022 0446    ----------------------------------------------------------------------------------------------------------------   Imaging Results:    DG Chest Port 1 View Result Date: 02/16/2024 EXAM: 1 VIEW(S) XRAY OF THE CHEST 02/16/2024 10:04:00 AM COMPARISON: 08/06/2023 CLINICAL HISTORY: Chest pain, history of sickle cell disease FINDINGS: LUNGS AND PLEURA: No focal pulmonary opacity. No pleural effusion. No pneumothorax. HEART AND MEDIASTINUM: Moderate cardiomegaly. BONES AND SOFT TISSUES: No acute osseous abnormality. IMPRESSION: 1. Moderate cardiomegaly. Otherwise, no acute cardiopulmonary abnormality.  Electronically signed by: Rogelia Myers MD 02/16/2024 10:43 AM EST RP Workstation: [id number]      Assessment & Plan:  Principal Problem:   Sickle cell anemia with crisis (HCC) Active Problems:   Sickle cell anemia (HCC)   Leukocytosis   Hb Sickle Cell Disease with crisis: Admit patient, start IVF 0.45% Saline @ 125 mls/hour, start weight based Dilaudid  PCA, Restart oral home pain medications, Monitor vitals very closely, Re-evaluate pain scale regularly, 2 L of Oxygen by [place], Patient will be re-evaluated for pain in the context of function and relationship to baseline as care progresses. Leukocytosis: Chronic, will continue to monitor without antibiotics.  Anemia of Chronic Disease: Hemoglobin within patients baseline. Will continue to monitor daily cbc Chronic pain Syndrome: continue oral home pain medication.    DVT Prophylaxis: SCD  AM Labs Ordered, also please review Full Orders  Family Communication: Admission, patient's condition and plan of care including tests being ordered have been discussed with the patient who indicate understanding and agree with the plan and Code Status.  Code Status: Full Code  Consults called: None    Admission status: Inpatient    Time spent in minutes : 50 minutes  Homer CHRISTELLA Cover NP  02/16/2024 at 3:56 PM

## 2024-02-17 DIAGNOSIS — Z3490 Encounter for supervision of normal pregnancy, unspecified, unspecified trimester: Secondary | ICD-10-CM

## 2024-02-17 LAB — CBC
HCT: 18.4 % — ABNORMAL LOW (ref 36.0–46.0)
Hemoglobin: 6.7 g/dL — CL (ref 12.0–15.0)
MCH: 33.2 pg (ref 26.0–34.0)
MCHC: 36.4 g/dL — ABNORMAL HIGH (ref 30.0–36.0)
MCV: 91.1 fL (ref 80.0–100.0)
Platelets: 379 K/uL (ref 150–400)
RBC: 2.02 MIL/uL — ABNORMAL LOW (ref 3.87–5.11)
RDW: 21.8 % — ABNORMAL HIGH (ref 11.5–15.5)
WBC: 20.9 K/uL — ABNORMAL HIGH (ref 4.0–10.5)
nRBC: 9.9 % — ABNORMAL HIGH (ref 0.0–0.2)

## 2024-02-17 LAB — PREPARE RBC (CROSSMATCH)

## 2024-02-17 MED ORDER — PRENATAL MULTIVITAMIN CH
1.0000 | ORAL_TABLET | Freq: Every day | ORAL | Status: DC
  Administered 2024-02-17 – 2024-02-21 (×5): 1 via ORAL
  Filled 2024-02-17 (×6): qty 1

## 2024-02-17 MED ORDER — SODIUM CHLORIDE 0.9% IV SOLUTION: Freq: Once | INTRAVENOUS | Status: AC

## 2024-02-17 NOTE — Progress Notes (Signed)
 Date and time results received: 02/17/24 6:53 AM  (use smartphrase .now to insert current time)  Test: hgb Critical Value: 6.7  Name of Provider Notified: Blondie Agent NP

## 2024-02-17 NOTE — Plan of Care (Signed)

## 2024-02-17 NOTE — Progress Notes (Addendum)
 Patient ID: Jessica Macias, female   DOB: [birth year], 18 y.o.   MRN: [id number] Subjective: Jessica Macias  is a 18 y.o. female, with a medical history significant for sickle cell SS disease, chronic sickle cell anemia, chronic leukocytosis, came to the ED with sudden onset of generalize body pain.   Patient is reporting improved pain of 5/10 with significant improvement from admission. No other concerns,  Denies headache, N/V/D, chills, cough. No urinary symptoms.   Objective:  Vital signs in last 24 hours:  Vitals:   02/17/24 0400 02/17/24 0824 02/17/24 1001 02/17/24 1153  BP:   129/78 137/76  Pulse:   100 (!) 114  Resp: 17 20  (!) 24  Temp:   (!) 102.9 F (39.4 C) 99.9 F (37.7 C)  TempSrc:   Oral Oral  SpO2:   91% 91%  Weight:      Height:        Intake/Output from previous day:  No intake or output data in the 24 hours ending 02/17/24 1223  Physical Exam: General: Alert, awake, oriented x3, in no acute distress.  HEENT: [place]/AT PEERL, EOMI Neck: Trachea midline,  no masses, no thyromegal,y no JVD, no carotid bruit OROPHARYNX:  Moist, No exudate/ erythema/lesions.  Heart: Regular rate and rhythm, without murmurs, rubs, gallops, PMI non-displaced, no heaves or thrills on palpation.  Lungs: Clear to auscultation, no wheezing or rhonchi noted. No increased vocal fremitus resonant to percussion  Abdomen: Soft, nontender, nondistended, positive bowel sounds, no masses no hepatosplenomegaly noted..  Neuro: No focal neurological deficits noted cranial nerves II through XII grossly intact. DTRs 2+ bilaterally upper and lower extremities. Strength 5 out of 5 in bilateral upper and lower extremities. Musculoskeletal: Generalize body tenderness Psychiatric: Patient alert and oriented x3, good insight and cognition, good recent to remote recall. Lymph node survey: No cervical axillary or inguinal lymphadenopathy noted.  Lab Results:  Basic Metabolic Panel:    Component Value Date/Time    NA 137 02/16/2024 0953   K 4.0 02/16/2024 0953   CL 103 02/16/2024 0953   CO2 21 (L) 02/16/2024 0953   BUN 7 02/16/2024 0953   CREATININE 0.56 02/16/2024 0953   GLUCOSE 128 (H) 02/16/2024 0953   CALCIUM 8.9 02/16/2024 0953   CBC:    Component Value Date/Time   WBC 20.9 (H) 02/17/2024 0553   HGB 6.7 (LL) 02/17/2024 0553   HCT 18.4 (L) 02/17/2024 0553   PLT 379 02/17/2024 0553   MCV 91.1 02/17/2024 0553   NEUTROABS 22.1 (H) 02/16/2024 0953   LYMPHSABS 2.2 02/16/2024 0953   MONOABS 2.4 (H) 02/16/2024 0953   EOSABS 0.0 02/16/2024 0953   BASOSABS 0.2 (H) 02/16/2024 0953    Recent Results (from the past 240 hours)  Resp panel by RT-PCR (RSV, Flu A&B, Covid) Anterior Nasal Swab     Status: None   Collection Time: 02/16/24 10:02 AM   Specimen: Anterior Nasal Swab  Result Value Ref Range Status   SARS Coronavirus 2 by RT PCR NEGATIVE NEGATIVE Final    Comment: (NOTE) SARS-CoV-2 target nucleic acids are NOT DETECTED.  The SARS-CoV-2 RNA is generally detectable in upper respiratory specimens during the acute phase of infection. The lowest concentration of SARS-CoV-2 viral copies this assay can detect is 138 copies/mL. A negative result does not preclude SARS-Cov-2 infection and should not be used as the sole basis for treatment or other patient management decisions. A negative result may occur with  improper specimen collection/handling, submission of specimen other than  nasopharyngeal swab, presence of viral mutation(s) within the areas targeted by this assay, and inadequate number of viral copies(<138 copies/mL). A negative result must be combined with clinical observations, patient history, and epidemiological information. The expected result is Negative.  Fact Sheet for Patients:  bloggercourse.com  Fact Sheet for Healthcare Providers:  seriousbroker.it  This test is no t yet approved or cleared by the [place]  FDA  and  has been authorized for detection and/or diagnosis of SARS-CoV-2 by FDA under an Emergency Use Authorization (EUA). This EUA will remain  in effect (meaning this test can be used) for the duration of the COVID-19 declaration under Section 564(b)(1) of the Act, 21 U.S.C.section 360bbb-3(b)(1), unless the authorization is terminated  or revoked sooner.       Influenza A by PCR NEGATIVE NEGATIVE Final   Influenza B by PCR NEGATIVE NEGATIVE Final    Comment: (NOTE) The Xpert Xpress SARS-CoV-2/FLU/RSV plus assay is intended as an aid in the diagnosis of influenza from Nasopharyngeal swab specimens and should not be used as a sole basis for treatment. Nasal washings and aspirates are unacceptable for Xpert Xpress SARS-CoV-2/FLU/RSV testing.  Fact Sheet for Patients: bloggercourse.com  Fact Sheet for Healthcare Providers: seriousbroker.it  This test is not yet approved or cleared by the [place]  FDA and has been authorized for detection and/or diagnosis of SARS-CoV-2 by FDA under an Emergency Use Authorization (EUA). This EUA will remain in effect (meaning this test can be used) for the duration of the COVID-19 declaration under Section 564(b)(1) of the Act, 21 U.S.C. section 360bbb-3(b)(1), unless the authorization is terminated or revoked.     Resp Syncytial Virus by PCR NEGATIVE NEGATIVE Final    Comment: (NOTE) Fact Sheet for Patients: bloggercourse.com  Fact Sheet for Healthcare Providers: seriousbroker.it  This test is not yet approved or cleared by the [place]  FDA and has been authorized for detection and/or diagnosis of SARS-CoV-2 by FDA under an Emergency Use Authorization (EUA). This EUA will remain in effect (meaning this test can be used) for the duration of the COVID-19 declaration under Section 564(b)(1) of the Act, 21 U.S.C. section 360bbb-3(b)(1),  unless the authorization is terminated or revoked.  Performed at [facility], [address] [place], [place] [address]     Studies/Results: DG Chest Port 1 View Result Date: 02/16/2024 EXAM: 1 VIEW(S) XRAY OF THE CHEST 02/16/2024 10:04:00 AM COMPARISON: 08/06/2023 CLINICAL HISTORY: Chest pain, history of sickle cell disease FINDINGS: LUNGS AND PLEURA: No focal pulmonary opacity. No pleural effusion. No pneumothorax. HEART AND MEDIASTINUM: Moderate cardiomegaly. BONES AND SOFT TISSUES: No acute osseous abnormality. IMPRESSION: 1. Moderate cardiomegaly. Otherwise, no acute cardiopulmonary abnormality. Electronically signed by: Rogelia Myers MD 02/16/2024 10:43 AM EST RP Workstation: [id number]    Medications: Scheduled Meds:  sodium chloride    Intravenous Once   HYDROmorphone    Intravenous Q4H   prenatal multivitamin  1 tablet Oral Q1200   senna-docusate  1 tablet Oral BID   [START ON 02/18/2024] Vitamin D  (Ergocalciferol )  50,000 Units Oral Q7 days   Continuous Infusions: PRN Meds:.acetaminophen , diphenhydrAMINE , naloxone  **AND** sodium chloride  flush, ondansetron  (ZOFRAN ) IV, polyethylene glycol  Consultants: None  Procedures: None  Antibiotics: None  Assessment/Plan: Principal Problem:   Sickle cell anemia with crisis (HCC) Active Problems:   Sickle cell anemia (HCC)   Leukocytosis   Pregnant   Hb Sickle Cell Disease with Pain crisis: pain is gradually improving, Continue IVF 0.45% Saline @ KVO, continue weight based Dilaudid  PCA, IV Toradol   is contraindicated for patient due to current pregnancy status. continue oral home pain medications as ordered. Monitor vitals very closely, Re-evaluate pain scale regularly, 2 L of Oxygen by [place]. Patient encouraged to ambulate on the hallway today.  Pregnant: First trimester. Start prenatal vitamins, Discontinue all teratogens, follow up out patient with PCP and OBGYN  Leukocytosis: Chronic, however improving.  Will continue to monitor without antibiotics.  Anemia of Chronic Disease: HGB less than patients baseline. Transfuse 1 unit PRBCs. Continue to monitor daily CBC.  Chronic pain Syndrome: Continue oral home pain medication.    Code Status: Full Code Family Communication: N/A Disposition Plan: Not yet ready for discharge  Homer CHRISTELLA Cover NP   If 7PM-7AM, please contact night-coverage.  02/17/2024, 12:23 PM  LOS: 1 day

## 2024-02-17 NOTE — Progress Notes (Signed)
   02/17/24 1001  Assess: MEWS Score  Temp (!) 102.9 F (39.4 C)  BP 129/78  MAP (mmHg) 93  Pulse Rate 100  SpO2 91 %  O2 Device Room Air  Assess: MEWS Score  MEWS Temp 2  MEWS Systolic 0  MEWS Pulse 0  MEWS RR 0  MEWS LOC 0  MEWS Score 2  MEWS Score Color Yellow  Assess: if the MEWS score is Yellow or Red  Were vital signs accurate and taken at a resting state? Yes  Does the patient meet 2 or more of the SIRS criteria? No  MEWS guidelines implemented  Yes, yellow  Treat  MEWS Interventions Considered administering scheduled or prn medications/treatments as ordered  Take Vital Signs  Increase Vital Sign Frequency  Yellow: Q2hr x1, continue Q4hrs until patient remains green for 12hrs  Escalate  MEWS: Escalate Yellow: Discuss with charge nurse and consider notifying provider and/or RRT  Notify: Charge Nurse/RN  Name of Charge Nurse/RN Notified Ingrid ,RN  Provider Notification  Provider Name/Title Homer Register , NP  Date Provider Notified 02/17/24  Time Provider Notified 1002  Method of Notification Face-to-face  Notification Reason Change in status  Provider response At bedside  Date of Provider Response 02/17/24  Time of Provider Response 1002  Assess: SIRS CRITERIA  SIRS Temperature  1  SIRS Respirations  0  SIRS Pulse 1  SIRS WBC 1  SIRS Score Sum  3

## 2024-02-18 LAB — URINALYSIS, ROUTINE W REFLEX MICROSCOPIC
Bilirubin Urine: NEGATIVE
Glucose, UA: NEGATIVE mg/dL
Ketones, ur: NEGATIVE mg/dL
Leukocytes,Ua: NEGATIVE
Nitrite: NEGATIVE
Protein, ur: 100 mg/dL — AB
Specific Gravity, Urine: 1.011 (ref 1.005–1.030)
pH: 6 (ref 5.0–8.0)

## 2024-02-18 LAB — CBC
HCT: 21.5 % — ABNORMAL LOW (ref 36.0–46.0)
Hemoglobin: 7.6 g/dL — ABNORMAL LOW (ref 12.0–15.0)
MCH: 31.1 pg (ref 26.0–34.0)
MCHC: 35.3 g/dL (ref 30.0–36.0)
MCV: 88.1 fL (ref 80.0–100.0)
Platelets: 334 K/uL (ref 150–400)
RBC: 2.44 MIL/uL — ABNORMAL LOW (ref 3.87–5.11)
RDW: 19.9 % — ABNORMAL HIGH (ref 11.5–15.5)
WBC: 24.2 K/uL — ABNORMAL HIGH (ref 4.0–10.5)
nRBC: 3.8 % — ABNORMAL HIGH (ref 0.0–0.2)

## 2024-02-18 LAB — LACTIC ACID, PLASMA
Lactic Acid, Venous: 0.6 mmol/L (ref 0.5–1.9)
Lactic Acid, Venous: 0.6 mmol/L (ref 0.5–1.9)

## 2024-02-18 LAB — MISC LABCORP TEST (SEND OUT): Labcorp test code: 180076

## 2024-02-18 MED ORDER — SODIUM CHLORIDE 0.9 % IV SOLN
1.0000 g | Freq: Every day | INTRAVENOUS | Status: DC
  Administered 2024-02-18 – 2024-02-21 (×4): 1 g via INTRAVENOUS
  Filled 2024-02-18 (×4): qty 10

## 2024-02-18 NOTE — Progress Notes (Signed)
   02/18/24 2201  Vitals  Temp (!) 103.1 F (39.5 C)  Temp Source Oral  BP (!) 106/58  MAP (mmHg) 73  BP Method Automatic  Pulse Rate (!) 131  Pulse Rate Source Monitor  Resp (!) 22  MEWS COLOR  MEWS Score Color Red  Oxygen Therapy  SpO2 98 %  MEWS Score  MEWS Temp 2  MEWS Systolic 0  MEWS Pulse 3  MEWS RR 1  MEWS LOC 0  MEWS Score 6   Patient in red mews due to temp, PR and low Bp. Charge nurse notified. Tylenol  administered, room temperature adjusted and ice packs applied. Hospitalist made aware.

## 2024-02-18 NOTE — Progress Notes (Signed)
 Patient ID: Jessica Macias, female   DOB: 2005-10-04, 18 y.o.   MRN: [id number] Subjective: Jessica Macias  is a 18 y.o. female, with a medical history significant for sickle cell SS disease, chronic sickle cell anemia, chronic leukocytosis, came to the ED with sudden onset of generalize body pain.   Patient is reporting improved pain of 5-4/10 with significant improvement from admission. No other concerns,  Denies headache, N/V/D, chills, cough. No urinary symptoms.   Objective:  Vital signs in last 24 hours:  Vitals:   02/18/24 0035 02/18/24 0357 02/18/24 0429 02/18/24 0742  BP: 125/78  125/73   Pulse: (!) 110  (!) 105   Resp: 18 18 18 18   Temp: 99 F (37.2 C)  100.3 F (37.9 C)   TempSrc: Oral  Oral   SpO2: 92%  92%   Weight:      Height:        Intake/Output from previous day:   Intake/Output Summary (Last 24 hours) at 02/18/2024 1107 Last data filed at 02/17/2024 1906 Gross per 24 hour  Intake 809.33 ml  Output --  Net 809.33 ml    Physical Exam: General: Alert, awake, oriented x3, in no acute distress.  HEENT: [place]/AT PEERL, EOMI Neck: Trachea midline,  no masses, no thyromegal,y no JVD, no carotid bruit OROPHARYNX:  Moist, No exudate/ erythema/lesions.  Heart: Regular rate and rhythm, without murmurs, rubs, gallops, PMI non-displaced, no heaves or thrills on palpation.  Lungs: Clear to auscultation, no wheezing or rhonchi noted. No increased vocal fremitus resonant to percussion  Abdomen: Soft, nontender, nondistended, positive bowel sounds, no masses no hepatosplenomegaly noted..  Neuro: No focal neurological deficits noted cranial nerves II through XII grossly intact. DTRs 2+ bilaterally upper and lower extremities. Strength 5 out of 5 in bilateral upper and lower extremities. Musculoskeletal: Generalize body tenderness Psychiatric: Patient alert and oriented x3, good insight and cognition, good recent to remote recall. Lymph node survey: No cervical axillary or  inguinal lymphadenopathy noted.  Lab Results:  Basic Metabolic Panel:    Component Value Date/Time   NA 137 02/16/2024 0953   K 4.0 02/16/2024 0953   CL 103 02/16/2024 0953   CO2 21 (L) 02/16/2024 0953   BUN 7 02/16/2024 0953   CREATININE 0.56 02/16/2024 0953   GLUCOSE 128 (H) 02/16/2024 0953   CALCIUM 8.9 02/16/2024 0953   CBC:    Component Value Date/Time   WBC 24.2 (H) 02/18/2024 0636   HGB 7.6 (L) 02/18/2024 0636   HCT 21.5 (L) 02/18/2024 0636   PLT 334 02/18/2024 0636   MCV 88.1 02/18/2024 0636   NEUTROABS 22.1 (H) 02/16/2024 0953   LYMPHSABS 2.2 02/16/2024 0953   MONOABS 2.4 (H) 02/16/2024 0953   EOSABS 0.0 02/16/2024 0953   BASOSABS 0.2 (H) 02/16/2024 0953    Recent Results (from the past 240 hours)  Resp panel by RT-PCR (RSV, Flu A&B, Covid) Anterior Nasal Swab     Status: None   Collection Time: 02/16/24 10:02 AM   Specimen: Anterior Nasal Swab  Result Value Ref Range Status   SARS Coronavirus 2 by RT PCR NEGATIVE NEGATIVE Final    Comment: (NOTE) SARS-CoV-2 target nucleic acids are NOT DETECTED.  The SARS-CoV-2 RNA is generally detectable in upper respiratory specimens during the acute phase of infection. The lowest concentration of SARS-CoV-2 viral copies this assay can detect is 138 copies/mL. A negative result does not preclude SARS-Cov-2 infection and should not be used as the sole basis for treatment or  other patient management decisions. A negative result may occur with  improper specimen collection/handling, submission of specimen other than nasopharyngeal swab, presence of viral mutation(s) within the areas targeted by this assay, and inadequate number of viral copies(<138 copies/mL). A negative result must be combined with clinical observations, patient history, and epidemiological information. The expected result is Negative.  Fact Sheet for Patients:  bloggercourse.com  Fact Sheet for Healthcare Providers:   seriousbroker.it  This test is no t yet approved or cleared by the [place]  FDA and  has been authorized for detection and/or diagnosis of SARS-CoV-2 by FDA under an Emergency Use Authorization (EUA). This EUA will remain  in effect (meaning this test can be used) for the duration of the COVID-19 declaration under Section 564(b)(1) of the Act, 21 U.S.C.section 360bbb-3(b)(1), unless the authorization is terminated  or revoked sooner.       Influenza A by PCR NEGATIVE NEGATIVE Final   Influenza B by PCR NEGATIVE NEGATIVE Final    Comment: (NOTE) The Xpert Xpress SARS-CoV-2/FLU/RSV plus assay is intended as an aid in the diagnosis of influenza from Nasopharyngeal swab specimens and should not be used as a sole basis for treatment. Nasal washings and aspirates are unacceptable for Xpert Xpress SARS-CoV-2/FLU/RSV testing.  Fact Sheet for Patients: bloggercourse.com  Fact Sheet for Healthcare Providers: seriousbroker.it  This test is not yet approved or cleared by the [place]  FDA and has been authorized for detection and/or diagnosis of SARS-CoV-2 by FDA under an Emergency Use Authorization (EUA). This EUA will remain in effect (meaning this test can be used) for the duration of the COVID-19 declaration under Section 564(b)(1) of the Act, 21 U.S.C. section 360bbb-3(b)(1), unless the authorization is terminated or revoked.     Resp Syncytial Virus by PCR NEGATIVE NEGATIVE Final    Comment: (NOTE) Fact Sheet for Patients: bloggercourse.com  Fact Sheet for Healthcare Providers: seriousbroker.it  This test is not yet approved or cleared by the [place]  FDA and has been authorized for detection and/or diagnosis of SARS-CoV-2 by FDA under an Emergency Use Authorization (EUA). This EUA will remain in effect (meaning this test can be used) for  the duration of the COVID-19 declaration under Section 564(b)(1) of the Act, 21 U.S.C. section 360bbb-3(b)(1), unless the authorization is terminated or revoked.  Performed at [facility], [address] [place], [place] [address]   Culture, blood (Routine X 2) w Reflex to ID Panel     Status: None (Preliminary result)   Collection Time: 02/18/24  6:36 AM   Specimen: BLOOD RIGHT ARM  Result Value Ref Range Status   Specimen Description BLOOD RIGHT ARM  Final   Special Requests   Final    BOTTLES DRAWN AEROBIC AND ANAEROBIC Performed at [facility], [address], [place], [place] [address]    Culture PENDING  Incomplete   Report Status PENDING  Incomplete  Culture, blood (Routine X 2) w Reflex to ID Panel     Status: None (Preliminary result)   Collection Time: 02/18/24  6:36 AM   Specimen: BLOOD RIGHT HAND  Result Value Ref Range Status   Specimen Description BLOOD RIGHT HAND  Final   Special Requests   Final    BOTTLES DRAWN AEROBIC AND ANAEROBIC Performed at [facility], [address] [place], [place] [address]    Culture PENDING  Incomplete   Report Status PENDING  Incomplete    Studies/Results: No results found.   Medications: Scheduled  Meds:  HYDROmorphone    Intravenous Q4H   prenatal multivitamin  1 tablet Oral Q1200   senna-docusate  1 tablet Oral BID   Vitamin D  (Ergocalciferol )  50,000 Units Oral Q7 days   Continuous Infusions: PRN Meds:.acetaminophen , diphenhydrAMINE , naloxone  **AND** sodium chloride  flush, ondansetron  (ZOFRAN ) IV, polyethylene glycol  Consultants: None  Procedures: None  Antibiotics: None  Assessment/Plan: Principal Problem:   Sickle cell anemia with crisis (HCC) Active Problems:   Sickle cell anemia (HCC)   Leukocytosis   Pregnant   Hb Sickle Cell Disease with Pain crisis: pain is gradually improving, Continue IVF 0.45% Saline @ KVO, continue weight based Dilaudid  PCA, IV Toradol  is  contraindicated for patient due to current pregnancy status. continue oral home pain medications as ordered. Monitor vitals very closely, Re-evaluate pain scale regularly, 2 L of Oxygen by [place]. Patient encouraged to ambulate on the hallway today.  Pregnant: First trimester. Start prenatal vitamins, Discontinue all teratogens, follow up out patient with PCP and OBGYN  Leukocytosis: Chronic, however improving. Will continue to monitor without antibiotics.  Anemia of Chronic Disease: HGB has improved and at patients baseline 7.6 after transfusing 1 unit PRBCs.  Chronic pain Syndrome: Continue oral home pain medication.    Code Status: Full Code Family Communication: N/A Disposition Plan: Not yet ready for discharge  Homer CHRISTELLA Cover NP   If 7PM-7AM, please contact night-coverage.  02/18/2024, 11:07 AM  LOS: 2 days

## 2024-02-18 NOTE — Progress Notes (Signed)
       Overnight   NAME: Jessica Macias MRN: [id number] DOB : 06-23-2005    Date of Service   02/18/2024   HPI/Events of Note   Notified by RN for need for New UA order, urine was to old to repeat from previous sickle cell order   Interventions/ Plan   Reorder of UA for sickle cell.     [name]- Aram BSN RN CCRN AGACNP-BC Acute Care Nurse Practitioner [facility]

## 2024-02-18 NOTE — Plan of Care (Signed)
 MEWS Progress Note  Patient Details Name: Jessica Macias MRN: [id number] DOB: Jun 11, 2005 Today's Date: 02/18/2024   MEWS Flowsheet Documentation:  Assess: MEWS Score Temp: (!) 102.9 F (39.4 C) BP: 133/80 MAP (mmHg): 95 Pulse Rate: (!) 126 ECG Heart Rate: 90 Resp: (!) 30 Level of Consciousness: Alert SpO2: 97 % O2 Device: Nasal Cannula Patient Activity (if Appropriate): In bed Assess: MEWS Score MEWS Temp: 2 MEWS Systolic: 0 MEWS Pulse: 2 MEWS RR: 2 MEWS LOC: 0 MEWS Score: 6 MEWS Score Color: Red Assess: SIRS CRITERIA SIRS Temperature : 1 SIRS Respirations : 1 SIRS Pulse: 1 SIRS WBC: 0 SIRS Score Sum : 3 SIRS Temperature : 0 SIRS Pulse: 1 SIRS Respirations : 0 SIRS WBC: 1 SIRS Score Sum : 2   MD Onyeje notified     Etheridge Geil Alondra  Mozqueda Jaramillo 02/18/2024, 2:38 PM   Problem: Education: Goal: Knowledge of vaso-occlusive preventative measures will improve Outcome: Progressing Goal: Awareness of infection prevention will improve Outcome: Progressing Goal: Awareness of signs and symptoms of anemia will improve Outcome: Progressing Goal: Long-term complications will improve Outcome: Progressing   Problem: Self-Care: Goal: Ability to incorporate actions that prevent/reduce pain crisis will improve Outcome: Progressing   Problem: Bowel/Gastric: Goal: Gut motility will be maintained Outcome: Progressing   Problem: Tissue Perfusion: Goal: Complications related to inadequate tissue perfusion will be avoided or minimized Outcome: Progressing   Problem: Respiratory: Goal: Pulmonary complications will be avoided or minimized Outcome: Progressing Goal: Acute Chest Syndrome will be identified early to prevent complications Outcome: Progressing   Problem: Fluid Volume: Goal: Ability to maintain a balanced intake and output will improve Outcome: Progressing   Problem: Sensory: Goal: Pain level will decrease with appropriate interventions Outcome:  Progressing   Problem: Health Behavior: Goal: Postive changes in compliance with treatment and prescription regimens will improve Outcome: Progressing   Problem: Education: Goal: Knowledge of General Education information will improve Description: Including pain rating scale, medication(s)/side effects and non-pharmacologic comfort measures Outcome: Progressing   Problem: Health Behavior/Discharge Planning: Goal: Ability to manage health-related needs will improve Outcome: Progressing   Problem: Clinical Measurements: Goal: Ability to maintain clinical measurements within normal limits will improve Outcome: Progressing Goal: Will remain free from infection Outcome: Progressing Goal: Diagnostic test results will improve Outcome: Progressing Goal: Respiratory complications will improve Outcome: Progressing Goal: Cardiovascular complication will be avoided Outcome: Progressing   Problem: Activity: Goal: Risk for activity intolerance will decrease Outcome: Progressing   Problem: Nutrition: Goal: Adequate nutrition will be maintained Outcome: Progressing   Problem: Coping: Goal: Level of anxiety will decrease Outcome: Progressing   Problem: Elimination: Goal: Will not experience complications related to bowel motility Outcome: Progressing Goal: Will not experience complications related to urinary retention Outcome: Progressing   Problem: Pain Managment: Goal: General experience of comfort will improve and/or be controlled Outcome: Progressing   Problem: Safety: Goal: Ability to remain free from injury will improve Outcome: Progressing   Problem: Skin Integrity: Goal: Risk for impaired skin integrity will decrease Outcome: Progressing  MEWS Progress Note  Patient Details Name: Jessica Macias MRN: [id number] DOB: 11/12/2005 Today's Date: 02/18/2024   MEWS Flowsheet Documentation:  Assess: MEWS Score Temp: (!) 102.9 F (39.4 C) BP: 133/80 MAP (mmHg):  95 Pulse Rate: (!) 126 ECG Heart Rate: 90 Resp: (!) 30 Level of Consciousness: Alert SpO2: 97 % O2 Device: Nasal Cannula Patient Activity (if Appropriate): In bed Assess: MEWS Score MEWS Temp: 2 MEWS Systolic: 0 MEWS Pulse: 2 MEWS RR: 2 MEWS LOC: 0  MEWS Score: 6 MEWS Score Color: Red Assess: SIRS CRITERIA SIRS Temperature : 1 SIRS Respirations : 1 SIRS Pulse: 1 SIRS WBC: 0 SIRS Score Sum : 3 SIRS Temperature : 0 SIRS Pulse: 1 SIRS Respirations : 0 SIRS WBC: 1 SIRS Score Sum : 2        Kitt Minardi Alondra  Mozqueda Jaramillo 02/18/2024, 2:38 PM

## 2024-02-18 NOTE — Plan of Care (Signed)

## 2024-02-18 NOTE — Progress Notes (Signed)
   02/18/24 1558  TOC Brief Assessment  Insurance and Status Reviewed  Patient has primary care physician Yes Mack, Madelin Patch, MD)  Home environment has been reviewed Home  Prior level of function: Independent  Prior/Current Home Services No current home services  Social Drivers of Health Review SDOH reviewed no interventions necessary  Readmission risk has been reviewed Yes  Transition of care needs no transition of care needs at this time

## 2024-02-19 DIAGNOSIS — D5700 Hb-SS disease with crisis, unspecified: Secondary | ICD-10-CM | POA: Diagnosis not present

## 2024-02-19 LAB — URINE CULTURE: Culture: NO GROWTH

## 2024-02-19 LAB — CBC
HCT: 20.6 % — ABNORMAL LOW (ref 36.0–46.0)
Hemoglobin: 7.4 g/dL — ABNORMAL LOW (ref 12.0–15.0)
MCH: 31 pg (ref 26.0–34.0)
MCHC: 35.9 g/dL (ref 30.0–36.0)
MCV: 86.2 fL (ref 80.0–100.0)
Platelets: 366 K/uL (ref 150–400)
RBC: 2.39 MIL/uL — ABNORMAL LOW (ref 3.87–5.11)
RDW: 18.8 % — ABNORMAL HIGH (ref 11.5–15.5)
WBC: 23.8 K/uL — ABNORMAL HIGH (ref 4.0–10.5)
nRBC: 3.9 % — ABNORMAL HIGH (ref 0.0–0.2)

## 2024-02-19 LAB — LACTIC ACID, PLASMA: Lactic Acid, Venous: 0.5 mmol/L (ref 0.5–1.9)

## 2024-02-19 NOTE — Plan of Care (Signed)
  Problem: Education: Goal: Knowledge of vaso-occlusive preventative measures will improve Outcome: Progressing Goal: Awareness of infection prevention will improve Outcome: Progressing Goal: Awareness of signs and symptoms of anemia will improve Outcome: Progressing Goal: Long-term complications will improve Outcome: Progressing   Problem: Self-Care: Goal: Ability to incorporate actions that prevent/reduce pain crisis will improve Outcome: Progressing   Problem: Bowel/Gastric: Goal: Gut motility will be maintained Outcome: Progressing   Problem: Tissue Perfusion: Goal: Complications related to inadequate tissue perfusion will be avoided or minimized Outcome: Progressing   Problem: Respiratory: Goal: Pulmonary complications will be avoided or minimized Outcome: Progressing Goal: Acute Chest Syndrome will be identified early to prevent complications Outcome: Progressing   Problem: Fluid Volume: Goal: Ability to maintain a balanced intake and output will improve Outcome: Progressing   Problem: Sensory: Goal: Pain level will decrease with appropriate interventions Outcome: Progressing   Problem: Health Behavior: Goal: Postive changes in compliance with treatment and prescription regimens will improve Outcome: Progressing   Problem: Education: Goal: Knowledge of General Education information will improve Description: Including pain rating scale, medication(s)/side effects and non-pharmacologic comfort measures Outcome: Progressing   Problem: Health Behavior/Discharge Planning: Goal: Ability to manage health-related needs will improve Outcome: Progressing   Problem: Clinical Measurements: Goal: Will remain free from infection Outcome: Progressing Goal: Diagnostic test results will improve Outcome: Progressing Goal: Respiratory complications will improve Outcome: Progressing Goal: Cardiovascular complication will be avoided Outcome: Progressing   Problem:  Activity: Goal: Risk for activity intolerance will decrease Outcome: Progressing   Problem: Nutrition: Goal: Adequate nutrition will be maintained Outcome: Progressing   Problem: Coping: Goal: Level of anxiety will decrease Outcome: Progressing   Problem: Elimination: Goal: Will not experience complications related to bowel motility Outcome: Progressing Goal: Will not experience complications related to urinary retention Outcome: Progressing   Problem: Pain Managment: Goal: General experience of comfort will improve and/or be controlled Outcome: Progressing   Problem: Safety: Goal: Ability to remain free from injury will improve Outcome: Progressing   Problem: Skin Integrity: Goal: Risk for impaired skin integrity will decrease Outcome: Progressing

## 2024-02-19 NOTE — Progress Notes (Signed)
 SICKLE CELL SERVICE PROGRESS NOTE  Jessica Macias FMW:[id number] DOB: 2006/02/27 DOA: 02/16/2024 PCP: Jolee Madelin Patch, MD  Assessment/Plan: Principal Problem:   Sickle cell anemia with crisis [facility]) Active Problems:   Sickle cell anemia (HCC)   Leukocytosis   Pregnant  Sickle cell anemia with crisis: Slowly improving.  Patient on Dilaudid  PCA, but no Toradol  due to pregnancy.  Continue with oral home medications.  Pain is improving.  Patient is ambulating.  Continue to titrate pain off. Anemia of chronic disease: Continue to monitor H&H. 1st trimester pregnancy: Continue supportive care and follow-up with OB/GYN Chronic pain syndrome: Continue chronic home regimen.  Code Status: Full code Family Communication: Full code Disposition Plan: Home when ready  [facility]  Pager [phone]. If 7PM-7AM, please contact night-coverage.  02/19/2024, 8:57 AM  LOS: 3 days   Brief narrative: Jessica Macias  is a 18 y.o. female, with a medical history significant for sickle cell SS disease, chronic sickle cell anemia, chronic leukocytosis, came to the ED with sudden onset of generalize body pain. Symptoms started on last night around midnight, patient woke up with severe pain involving bilateral shoulders, back, chest, and bilateral legs.  Chest discomfort has been sharp bilaterally, worsening with deep breathing. Denies any fever, chills, cough. No urinary symptoms.     Consultants: None  Procedures: Chest x-ray  Antibiotics: None  HPI/Subjective: Patient has low-grade temperature and slight tachycardia.  Pain is 7 out of 10.  She does have some nausea on and off.  Patient diagnosed mainly with first trimester pregnancy.  Objective: Vitals:   02/19/24 0403 02/19/24 0415 02/19/24 0500 02/19/24 0825  BP: 121/70     Pulse: (!) 125  (!) 116   Resp: (!) 24 20  20   Temp: (!) 102.4 F (39.1 C)  100.1 F (37.8 C) 98.6 F (37 C)  TempSrc: Oral  Oral Oral  SpO2: 98% 97%    Weight:       Height:       Weight change:   Intake/Output Summary (Last 24 hours) at 02/19/2024 0857 Last data filed at 02/18/2024 1705 Gross per 24 hour  Intake 153.7 ml  Output --  Net 153.7 ml    General: Alert, awake, oriented x3, in no acute distress.  HEENT: [place]/AT PEERL, EOMI Neck: Trachea midline,  no masses, no thyromegal,y no JVD, no carotid bruit OROPHARYNX:  Moist, No exudate/ erythema/lesions.  Heart: Regular rate and rhythm, without murmurs, rubs, gallops, PMI non-displaced, no heaves or thrills on palpation.  Lungs: Clear to auscultation, no wheezing or rhonchi noted. No increased vocal fremitus resonant to percussion  Abdomen: Soft, nontender, nondistended, positive bowel sounds, no masses no hepatosplenomegaly noted..  Neuro: No focal neurological deficits noted cranial nerves II through XII grossly intact. DTRs 2+ bilaterally upper and lower extremities. Strength 5 out of 5 in bilateral upper and lower extremities. Musculoskeletal: No warm swelling or erythema around joints, no spinal tenderness noted. Psychiatric: Patient alert and oriented x3, good insight and cognition, good recent to remote recall. Lymph node survey: No cervical axillary or inguinal lymphadenopathy noted.   Data Reviewed: Basic Metabolic Panel: Recent Labs  Lab 02/16/24 0953  NA 137  K 4.0  CL 103  CO2 21*  GLUCOSE 128*  BUN 7  CREATININE 0.56  CALCIUM 8.9   Liver Function Tests: No results for input(s): AST, ALT, ALKPHOS, BILITOT, PROT, ALBUMIN in the last 168 hours. No results for input(s): LIPASE, AMYLASE in the last 168 hours. No results for  input(s): AMMONIA in the last 168 hours. CBC: Recent Labs  Lab 02/16/24 0953 02/17/24 0553 02/18/24 0636 02/19/24 0554  WBC 29.3* 20.9* 24.2* 23.8*  NEUTROABS 22.1*  --   --   --   HGB 7.7* 6.7* 7.6* 7.4*  HCT 21.7* 18.4* 21.5* 20.6*  MCV 91.6 91.1 88.1 86.2  PLT 514* 379 334 366   Cardiac Enzymes: No results for  input(s): CKTOTAL, CKMB, CKMBINDEX, TROPONINI in the last 168 hours. BNP (last 3 results) No results for input(s): BNP in the last 8760 hours.  ProBNP (last 3 results) No results for input(s): PROBNP in the last 8760 hours.  CBG: No results for input(s): GLUCAP in the last 168 hours.  Recent Results (from the past 240 hours)  Resp panel by RT-PCR (RSV, Flu A&B, Covid) Anterior Nasal Swab     Status: None   Collection Time: 02/16/24 10:02 AM   Specimen: Anterior Nasal Swab  Result Value Ref Range Status   SARS Coronavirus 2 by RT PCR NEGATIVE NEGATIVE Final    Comment: (NOTE) SARS-CoV-2 target nucleic acids are NOT DETECTED.  The SARS-CoV-2 RNA is generally detectable in upper respiratory specimens during the acute phase of infection. The lowest concentration of SARS-CoV-2 viral copies this assay can detect is 138 copies/mL. A negative result does not preclude SARS-Cov-2 infection and should not be used as the sole basis for treatment or other patient management decisions. A negative result may occur with  improper specimen collection/handling, submission of specimen other than nasopharyngeal swab, presence of viral mutation(s) within the areas targeted by this assay, and inadequate number of viral copies(<138 copies/mL). A negative result must be combined with clinical observations, patient history, and epidemiological information. The expected result is Negative.  Fact Sheet for Patients:  bloggercourse.com  Fact Sheet for Healthcare Providers:  seriousbroker.it  This test is no t yet approved or cleared by the [place]  FDA and  has been authorized for detection and/or diagnosis of SARS-CoV-2 by FDA under an Emergency Use Authorization (EUA). This EUA will remain  in effect (meaning this test can be used) for the duration of the COVID-19 declaration under Section 564(b)(1) of the Act, 21 U.S.C.section  360bbb-3(b)(1), unless the authorization is terminated  or revoked sooner.       Influenza A by PCR NEGATIVE NEGATIVE Final   Influenza B by PCR NEGATIVE NEGATIVE Final    Comment: (NOTE) The Xpert Xpress SARS-CoV-2/FLU/RSV plus assay is intended as an aid in the diagnosis of influenza from Nasopharyngeal swab specimens and should not be used as a sole basis for treatment. Nasal washings and aspirates are unacceptable for Xpert Xpress SARS-CoV-2/FLU/RSV testing.  Fact Sheet for Patients: bloggercourse.com  Fact Sheet for Healthcare Providers: seriousbroker.it  This test is not yet approved or cleared by the [place]  FDA and has been authorized for detection and/or diagnosis of SARS-CoV-2 by FDA under an Emergency Use Authorization (EUA). This EUA will remain in effect (meaning this test can be used) for the duration of the COVID-19 declaration under Section 564(b)(1) of the Act, 21 U.S.C. section 360bbb-3(b)(1), unless the authorization is terminated or revoked.     Resp Syncytial Virus by PCR NEGATIVE NEGATIVE Final    Comment: (NOTE) Fact Sheet for Patients: bloggercourse.com  Fact Sheet for Healthcare Providers: seriousbroker.it  This test is not yet approved or cleared by the [place]  FDA and has been authorized for detection and/or diagnosis of SARS-CoV-2 by FDA under an Emergency Use Authorization (EUA). This EUA will  remain in effect (meaning this test can be used) for the duration of the COVID-19 declaration under Section 564(b)(1) of the Act, 21 U.S.C. section 360bbb-3(b)(1), unless the authorization is terminated or revoked.  Performed at [facility], [address], [place], [place] [address]   Culture, blood (Routine X 2) w Reflex to ID Panel     Status: None (Preliminary result)   Collection Time: 02/18/24  6:36 AM   Specimen:  BLOOD RIGHT ARM  Result Value Ref Range Status   Specimen Description BLOOD RIGHT ARM  Final   Special Requests BOTTLES DRAWN AEROBIC AND ANAEROBIC  Final   Culture   Final    NO GROWTH 1 DAY Performed at [facility], [address] [place], [place] [address]    Report Status PENDING  Incomplete  Culture, blood (Routine X 2) w Reflex to ID Panel     Status: None (Preliminary result)   Collection Time: 02/18/24  6:36 AM   Specimen: BLOOD RIGHT HAND  Result Value Ref Range Status   Specimen Description BLOOD RIGHT HAND  Final   Special Requests BOTTLES DRAWN AEROBIC AND ANAEROBIC  Final   Culture   Final    NO GROWTH 1 DAY Performed at [facility], [address], [place], [place] [address]    Report Status PENDING  Incomplete     Studies: DG Chest Port 1 View Result Date: 02/16/2024 EXAM: 1 VIEW(S) XRAY OF THE CHEST 02/16/2024 10:04:00 AM COMPARISON: 08/06/2023 CLINICAL HISTORY: Chest pain, history of sickle cell disease FINDINGS: LUNGS AND PLEURA: No focal pulmonary opacity. No pleural effusion. No pneumothorax. HEART AND MEDIASTINUM: Moderate cardiomegaly. BONES AND SOFT TISSUES: No acute osseous abnormality. IMPRESSION: 1. Moderate cardiomegaly. Otherwise, no acute cardiopulmonary abnormality. Electronically signed by: Rogelia Myers MD 02/16/2024 10:43 AM EST RP Workstation: [id number]    Scheduled Meds:  HYDROmorphone    Intravenous Q4H   prenatal multivitamin  1 tablet Oral Q1200   senna-docusate  1 tablet Oral BID   Vitamin D  (Ergocalciferol )  50,000 Units Oral Q7 days   Continuous Infusions:  cefTRIAXone  (ROCEPHIN )  IV Stopped (02/18/24 1603)    Principal Problem:   Sickle cell anemia with crisis (HCC) Active Problems:   Sickle cell anemia (HCC)   Leukocytosis   Pregnant

## 2024-02-19 NOTE — Plan of Care (Signed)
  Problem: Education: Goal: Awareness of infection prevention will improve Outcome: Progressing Goal: Awareness of signs and symptoms of anemia will improve Outcome: Progressing   Problem: Education: Goal: Knowledge of vaso-occlusive preventative measures will improve Outcome: Not Progressing Goal: Long-term complications will improve Outcome: Not Progressing

## 2024-02-20 DIAGNOSIS — D5700 Hb-SS disease with crisis, unspecified: Secondary | ICD-10-CM | POA: Diagnosis not present

## 2024-02-20 LAB — CBC WITH DIFFERENTIAL/PLATELET
Abs Immature Granulocytes: 0.17 K/uL — ABNORMAL HIGH (ref 0.00–0.07)
Basophils Absolute: 0.1 K/uL (ref 0.0–0.1)
Basophils Relative: 1 %
Eosinophils Absolute: 0.1 K/uL (ref 0.0–0.5)
Eosinophils Relative: 0 %
HCT: 19.1 % — ABNORMAL LOW (ref 36.0–46.0)
Hemoglobin: 6.6 g/dL — CL (ref 12.0–15.0)
Immature Granulocytes: 1 %
Lymphocytes Relative: 12 %
Lymphs Abs: 2.2 K/uL (ref 0.7–4.0)
MCH: 29.7 pg (ref 26.0–34.0)
MCHC: 34.6 g/dL (ref 30.0–36.0)
MCV: 86 fL (ref 80.0–100.0)
Monocytes Absolute: 2.4 K/uL — ABNORMAL HIGH (ref 0.1–1.0)
Monocytes Relative: 14 %
Neutro Abs: 12.5 K/uL — ABNORMAL HIGH (ref 1.7–7.7)
Neutrophils Relative %: 72 %
Platelets: 378 K/uL (ref 150–400)
RBC: 2.22 MIL/uL — ABNORMAL LOW (ref 3.87–5.11)
RDW: 18.6 % — ABNORMAL HIGH (ref 11.5–15.5)
Smear Review: NORMAL
WBC: 17.4 K/uL — ABNORMAL HIGH (ref 4.0–10.5)
nRBC: 8.1 % — ABNORMAL HIGH (ref 0.0–0.2)

## 2024-02-20 LAB — HEMOGLOBIN AND HEMATOCRIT, BLOOD
HCT: 22.2 % — ABNORMAL LOW (ref 36.0–46.0)
Hemoglobin: 7.4 g/dL — ABNORMAL LOW (ref 12.0–15.0)

## 2024-02-20 LAB — HCG, QUANTITATIVE, PREGNANCY: hCG, Beta Chain, Quant, S: 29 m[IU]/mL — ABNORMAL HIGH (ref <5)

## 2024-02-20 LAB — PREPARE RBC (CROSSMATCH)

## 2024-02-20 MED ORDER — SODIUM CHLORIDE 0.9% IV SOLUTION: Freq: Once | INTRAVENOUS | Status: AC

## 2024-02-20 NOTE — Progress Notes (Signed)
 SICKLE CELL SERVICE PROGRESS NOTE  Jessica Macias FMW:[id number] DOB: [birth year] DOA: 02/16/2024 PCP: Jolee Madelin Patch, MD  Assessment/Plan: Principal Problem:   Sickle cell anemia with crisis [facility]) Active Problems:   Sickle cell anemia (HCC)   Leukocytosis   Pregnant  Sickle cell anemia with crisis: Patient is slowly improving.  Patient on Dilaudid  PCA, but no Toradol  due to suspected pregnancy.  Continue with oral home medications.  Pain is improving.  Patient is ambulating.  Continue to titrate pain off. Anemia of chronic disease: Continue to monitor H&H. Suspected 1st trimester pregnancy: Patient has had bleeding after the initial hCG checked was elevated.  It was originally 113.  Will recheck today it is down to 29.  With patient bleeding now she may have been originally pregnant but lost the baby or she was never pregnant  in the first place.  She will need an ultrasound soon and follow-up outpatient with recheck of her hCG quantitatively and qualitatively.  Continue supportive care and follow-up with OB/GYN Chronic pain syndrome: Continue chronic home regimen.  Code Status: Full code Family Communication: Full code Disposition Plan: Home when ready  [facility]  Pager [phone]. If 7PM-7AM, please contact night-coverage.  02/20/2024, 1:34 PM  LOS: 4 days   Brief narrative: Jessica Macias  is a 18 y.o. female, with a medical history significant for sickle cell SS disease, chronic sickle cell anemia, chronic leukocytosis, came to the ED with sudden onset of generalize body pain. Symptoms started on last night around midnight, patient woke up with severe pain involving bilateral shoulders, back, chest, and bilateral legs.  Chest discomfort has been sharp bilaterally, worsening with deep breathing. Denies any fever, chills, cough. No urinary symptoms.     Consultants: None  Procedures: Chest x-ray  Antibiotics: None  HPI/Subjective: Patient has improved and is now  being transition to oral pain medication.  Patient is bleeding this is her period.   Objective: Vitals:   02/20/24 0810 02/20/24 1139 02/20/24 1144 02/20/24 1157  BP:  119/67  126/68  Pulse:  90  100  Resp: 20 18 20 20   Temp:  98.5 F (36.9 C)  98.6 F (37 C)  TempSrc:  Oral  Oral  SpO2: 99% 99% 99% 100%  Weight:      Height:       Weight change:  No intake or output data in the 24 hours ending 02/20/24 1334   General: Alert, awake, oriented x3, in no acute distress.  HEENT: [place]/AT PEERL, EOMI Neck: Trachea midline,  no masses, no thyromegal,y no JVD, no carotid bruit OROPHARYNX:  Moist, No exudate/ erythema/lesions.  Heart: Regular rate and rhythm, without murmurs, rubs, gallops, PMI non-displaced, no heaves or thrills on palpation.  Lungs: Clear to auscultation, no wheezing or rhonchi noted. No increased vocal fremitus resonant to percussion  Abdomen: Soft, nontender, nondistended, positive bowel sounds, no masses no hepatosplenomegaly noted..  Neuro: No focal neurological deficits noted cranial nerves II through XII grossly intact. DTRs 2+ bilaterally upper and lower extremities. Strength 5 out of 5 in bilateral upper and lower extremities. Musculoskeletal: No warm swelling or erythema around joints, no spinal tenderness noted. Psychiatric: Patient alert and oriented x3, good insight and cognition, good recent to remote recall. Lymph node survey: No cervical axillary or inguinal lymphadenopathy noted.   Data Reviewed: Basic Metabolic Panel: Recent Labs  Lab 02/16/24 0953  NA 137  K 4.0  CL 103  CO2 21*  GLUCOSE 128*  BUN 7  CREATININE 0.56  CALCIUM 8.9   Liver Function Tests: No results for input(s): AST, ALT, ALKPHOS, BILITOT, PROT, ALBUMIN in the last 168 hours. No results for input(s): LIPASE, AMYLASE in the last 168 hours. No results for input(s): AMMONIA in the last 168 hours. CBC: Recent Labs  Lab 02/16/24 0953 02/17/24 0553  02/18/24 0636 02/19/24 0554 02/20/24 0743  WBC 29.3* 20.9* 24.2* 23.8* 17.4*  NEUTROABS 22.1*  --   --   --  12.5*  HGB 7.7* 6.7* 7.6* 7.4* 6.6*  HCT 21.7* 18.4* 21.5* 20.6* 19.1*  MCV 91.6 91.1 88.1 86.2 86.0  PLT 514* 379 334 366 378   Cardiac Enzymes: No results for input(s): CKTOTAL, CKMB, CKMBINDEX, TROPONINI in the last 168 hours. BNP (last 3 results) No results for input(s): BNP in the last 8760 hours.  ProBNP (last 3 results) No results for input(s): PROBNP in the last 8760 hours.  CBG: No results for input(s): GLUCAP in the last 168 hours.  Recent Results (from the past 240 hours)  Resp panel by RT-PCR (RSV, Flu A&B, Covid) Anterior Nasal Swab     Status: None   Collection Time: 02/16/24 10:02 AM   Specimen: Anterior Nasal Swab  Result Value Ref Range Status   SARS Coronavirus 2 by RT PCR NEGATIVE NEGATIVE Final    Comment: (NOTE) SARS-CoV-2 target nucleic acids are NOT DETECTED.  The SARS-CoV-2 RNA is generally detectable in upper respiratory specimens during the acute phase of infection. The lowest concentration of SARS-CoV-2 viral copies this assay can detect is 138 copies/mL. A negative result does not preclude SARS-Cov-2 infection and should not be used as the sole basis for treatment or other patient management decisions. A negative result may occur with  improper specimen collection/handling, submission of specimen other than nasopharyngeal swab, presence of viral mutation(s) within the areas targeted by this assay, and inadequate number of viral copies(<138 copies/mL). A negative result must be combined with clinical observations, patient history, and epidemiological information. The expected result is Negative.  Fact Sheet for Patients:  bloggercourse.com  Fact Sheet for Healthcare Providers:  seriousbroker.it  This test is no t yet approved or cleared by the [place]  FDA and  has  been authorized for detection and/or diagnosis of SARS-CoV-2 by FDA under an Emergency Use Authorization (EUA). This EUA will remain  in effect (meaning this test can be used) for the duration of the COVID-19 declaration under Section 564(b)(1) of the Act, 21 U.S.C.section 360bbb-3(b)(1), unless the authorization is terminated  or revoked sooner.       Influenza A by PCR NEGATIVE NEGATIVE Final   Influenza B by PCR NEGATIVE NEGATIVE Final    Comment: (NOTE) The Xpert Xpress SARS-CoV-2/FLU/RSV plus assay is intended as an aid in the diagnosis of influenza from Nasopharyngeal swab specimens and should not be used as a sole basis for treatment. Nasal washings and aspirates are unacceptable for Xpert Xpress SARS-CoV-2/FLU/RSV testing.  Fact Sheet for Patients: bloggercourse.com  Fact Sheet for Healthcare Providers: seriousbroker.it  This test is not yet approved or cleared by the [place]  FDA and has been authorized for detection and/or diagnosis of SARS-CoV-2 by FDA under an Emergency Use Authorization (EUA). This EUA will remain in effect (meaning this test can be used) for the duration of the COVID-19 declaration under Section 564(b)(1) of the Act, 21 U.S.C. section 360bbb-3(b)(1), unless the authorization is terminated or revoked.     Resp Syncytial Virus by PCR NEGATIVE NEGATIVE Final    Comment: (NOTE) Fact Sheet for  Patients: bloggercourse.com  Fact Sheet for Healthcare Providers: seriousbroker.it  This test is not yet approved or cleared by the [place]  FDA and has been authorized for detection and/or diagnosis of SARS-CoV-2 by FDA under an Emergency Use Authorization (EUA). This EUA will remain in effect (meaning this test can be used) for the duration of the COVID-19 declaration under Section 564(b)(1) of the Act, 21 U.S.C. section 360bbb-3(b)(1), unless the  authorization is terminated or revoked.  Performed at [facility], [address], [place], [place] [address]   Culture, blood (Routine X 2) w Reflex to ID Panel     Status: None (Preliminary result)   Collection Time: 02/18/24  6:36 AM   Specimen: BLOOD RIGHT ARM  Result Value Ref Range Status   Specimen Description BLOOD RIGHT ARM  Final   Special Requests BOTTLES DRAWN AEROBIC AND ANAEROBIC  Final   Culture   Final    NO GROWTH 2 DAYS Performed at [facility], [address] [place], [place] [address]    Report Status PENDING  Incomplete  Culture, blood (Routine X 2) w Reflex to ID Panel     Status: None (Preliminary result)   Collection Time: 02/18/24  6:36 AM   Specimen: BLOOD RIGHT HAND  Result Value Ref Range Status   Specimen Description BLOOD RIGHT HAND  Final   Special Requests BOTTLES DRAWN AEROBIC AND ANAEROBIC  Final   Culture   Final    NO GROWTH 2 DAYS Performed at [facility], [address], [place], [place] [address]    Report Status PENDING  Incomplete  Urine Culture (for pregnant, neutropenic or urologic patients or patients with an indwelling urinary catheter)     Status: None   Collection Time: 02/18/24  4:03 PM   Specimen: Urine, Clean Catch  Result Value Ref Range Status   Specimen Description   Final    URINE, CLEAN CATCH Performed at [facility], [address], [place], [place] [address]    Special Requests   Final    Immunocompromised Performed at [facility], [address], [place], [place] [address]    Culture   Final    NO GROWTH Performed at [facility], [address], [place], [place] [address]    Report Status 02/19/2024 FINAL  Final     Studies: DG Chest Port 1 View Result Date: 02/16/2024 EXAM: 1 VIEW(S) XRAY OF THE CHEST 02/16/2024 10:04:00 AM COMPARISON: 08/06/2023 CLINICAL HISTORY: Chest pain, history of sickle cell disease FINDINGS: LUNGS AND  PLEURA: No focal pulmonary opacity. No pleural effusion. No pneumothorax. HEART AND MEDIASTINUM: Moderate cardiomegaly. BONES AND SOFT TISSUES: No acute osseous abnormality. IMPRESSION: 1. Moderate cardiomegaly. Otherwise, no acute cardiopulmonary abnormality. Electronically signed by: Rogelia Myers MD 02/16/2024 10:43 AM EST RP Workstation: [id number]    Scheduled Meds:  HYDROmorphone    Intravenous Q4H   prenatal multivitamin  1 tablet Oral Q1200   senna-docusate  1 tablet Oral BID   Vitamin D  (Ergocalciferol )  50,000 Units Oral Q7 days   Continuous Infusions:  cefTRIAXone  (ROCEPHIN )  IV 1 g (02/19/24 1344)    Principal Problem:   Sickle cell anemia with crisis (HCC) Active Problems:   Sickle cell anemia (HCC)   Leukocytosis   Pregnant

## 2024-02-20 NOTE — Progress Notes (Signed)
 0825 Critical HGB of 6.6 received from lab, Dr. Sim MD informed via secure chat, no new orders received at this time.

## 2024-02-21 DIAGNOSIS — D5700 Hb-SS disease with crisis, unspecified: Secondary | ICD-10-CM | POA: Diagnosis not present

## 2024-02-21 LAB — CBC WITH DIFFERENTIAL/PLATELET
Abs Immature Granulocytes: 0.11 K/uL — ABNORMAL HIGH (ref 0.00–0.07)
Basophils Absolute: 0.1 K/uL (ref 0.0–0.1)
Basophils Relative: 1 %
Eosinophils Absolute: 0 K/uL (ref 0.0–0.5)
Eosinophils Relative: 0 %
HCT: 22.2 % — ABNORMAL LOW (ref 36.0–46.0)
Hemoglobin: 7.7 g/dL — ABNORMAL LOW (ref 12.0–15.0)
Immature Granulocytes: 1 %
Lymphocytes Relative: 11 %
Lymphs Abs: 1.3 K/uL (ref 0.7–4.0)
MCH: 29.3 pg (ref 26.0–34.0)
MCHC: 34.7 g/dL (ref 30.0–36.0)
MCV: 84.4 fL (ref 80.0–100.0)
Monocytes Absolute: 1.8 K/uL — ABNORMAL HIGH (ref 0.1–1.0)
Monocytes Relative: 15 %
Neutro Abs: 8.5 K/uL — ABNORMAL HIGH (ref 1.7–7.7)
Neutrophils Relative %: 72 %
Platelets: 437 K/uL — ABNORMAL HIGH (ref 150–400)
RBC: 2.63 MIL/uL — ABNORMAL LOW (ref 3.87–5.11)
RDW: 18.1 % — ABNORMAL HIGH (ref 11.5–15.5)
Smear Review: NORMAL
WBC: 11.8 K/uL — ABNORMAL HIGH (ref 4.0–10.5)
nRBC: 14.1 % — ABNORMAL HIGH (ref 0.0–0.2)

## 2024-02-21 MED ORDER — OXYCODONE HCL 5 MG PO TABS
5.0000 mg | ORAL_TABLET | ORAL | Status: DC | PRN
  Administered 2024-02-21: 5 mg via ORAL
  Filled 2024-02-21: qty 1

## 2024-02-21 MED ORDER — OXYCODONE HCL 5 MG PO TABS: 5.0000 mg | ORAL_TABLET | ORAL | 0 refills | Status: AC | PRN

## 2024-02-21 NOTE — Progress Notes (Signed)
 Pt refused PCA and requesting to take oral medication instead. Provider aware. PCA pump disconnected from patient and medication wasted. Care continues.

## 2024-02-21 NOTE — Plan of Care (Signed)
  Problem: Sensory: Goal: Pain level will decrease with appropriate interventions Outcome: Progressing   Problem: Pain Managment: Goal: General experience of comfort will improve and/or be controlled Outcome: Progressing   Problem: Safety: Goal: Ability to remain free from injury will improve Outcome: Progressing   Problem: Skin Integrity: Goal: Risk for impaired skin integrity will decrease Outcome: Progressing

## 2024-02-21 NOTE — Discharge Summary (Incomplete)
 Physician Discharge Summary   Patient: Jessica Macias MRN: [id number] DOB: [birth year]  Admit date:     02/16/2024  Discharge date: {dischdate:26783}  Discharge Physician: SIM KNOLL   PCP: Jolee Madelin Patch, MD   Recommendations at discharge:  {Tip this will not be part of the note when signed- Example include specific recommendations for outpatient follow-up, pending tests to follow-up on. (Optional):26781}  ***  Discharge Diagnoses: Principal Problem:   Sickle cell anemia with crisis (HCC) Active Problems:   Sickle cell anemia (HCC)   Leukocytosis   Pregnant  Resolved Problems:   * No resolved hospital problems. [facility] Course: No notes on file  Assessment and Plan: No notes have been filed under this hospital service. Service: Hospitalist     {Tip this will not be part of the note when signed Body mass index is 21.42 kg/m. , ,  (Optional):26781}  {(NOTE) Pain control PDMP Statment (Optional):26782} Consultants: *** Procedures performed: ***  Disposition: {Plan; Disposition:26390} Diet recommendation:  {Diet_Plan:26776} DISCHARGE MEDICATION: Allergies as of 02/21/2024   No Known Allergies      Medication List     STOP taking these medications    hydroxyurea  400 MG capsule Commonly known as: DROXIA    predniSONE  10 MG tablet Commonly known as: DELTASONE        TAKE these medications    cetirizine  10 MG tablet Commonly known as: ZyrTEC  Allergy Take 1 tablet (10 mg total) by mouth daily.   folic acid  1 MG tablet Commonly known as: FOLVITE  Take 1 mg by mouth daily.   oxyCODONE  5 MG immediate release tablet Commonly known as: Oxy IR/ROXICODONE  Take 1 tablet (5 mg total) by mouth every 4 (four) hours as needed for breakthrough pain.   promethazine -dextromethorphan  6.25-15 MG/5ML syrup Commonly known as: PROMETHAZINE -DM Take 5 mLs by mouth 3 (three) times daily as needed for cough.   Vitamin D  (Ergocalciferol ) 1.25 MG (50000 UNIT) Caps  capsule Commonly known as: DRISDOL  Take 50,000 Units by mouth every Wednesday.        Discharge Exam: Filed Weights   02/16/24 9061  Weight: 65.8 kg   ***  Condition at discharge: {DC Condition:26389}  The results of significant diagnostics from this hospitalization (including imaging, microbiology, ancillary and laboratory) are listed below for reference.   Imaging Studies: DG Chest Port 1 View Result Date: 02/16/2024 EXAM: 1 VIEW(S) XRAY OF THE CHEST 02/16/2024 10:04:00 AM COMPARISON: 08/06/2023 CLINICAL HISTORY: Chest pain, history of sickle cell disease FINDINGS: LUNGS AND PLEURA: No focal pulmonary opacity. No pleural effusion. No pneumothorax. HEART AND MEDIASTINUM: Moderate cardiomegaly. BONES AND SOFT TISSUES: No acute osseous abnormality. IMPRESSION: 1. Moderate cardiomegaly. Otherwise, no acute cardiopulmonary abnormality. Electronically signed by: Rogelia Myers MD 02/16/2024 10:43 AM EST RP Workstation: [id number]    Microbiology: Results for orders placed or performed during the hospital encounter of 02/16/24  Resp panel by RT-PCR (RSV, Flu A&B, Covid) Anterior Nasal Swab     Status: None   Collection Time: 02/16/24 10:02 AM   Specimen: Anterior Nasal Swab  Result Value Ref Range Status   SARS Coronavirus 2 by RT PCR NEGATIVE NEGATIVE Final    Comment: (NOTE) SARS-CoV-2 target nucleic acids are NOT DETECTED.  The SARS-CoV-2 RNA is generally detectable in upper respiratory specimens during the acute phase of infection. The lowest concentration of SARS-CoV-2 viral copies this assay can detect is 138 copies/mL. A negative result does not preclude SARS-Cov-2 infection and should not be used as the sole basis for treatment or other patient  management decisions. A negative result may occur with  improper specimen collection/handling, submission of specimen other than nasopharyngeal swab, presence of viral mutation(s) within the areas targeted by this assay, and  inadequate number of viral copies(<138 copies/mL). A negative result must be combined with clinical observations, patient history, and epidemiological information. The expected result is Negative.  Fact Sheet for Patients:  bloggercourse.com  Fact Sheet for Healthcare Providers:  seriousbroker.it  This test is no t yet approved or cleared by the [place]  FDA and  has been authorized for detection and/or diagnosis of SARS-CoV-2 by FDA under an Emergency Use Authorization (EUA). This EUA will remain  in effect (meaning this test can be used) for the duration of the COVID-19 declaration under Section 564(b)(1) of the Act, 21 U.S.C.section 360bbb-3(b)(1), unless the authorization is terminated  or revoked sooner.       Influenza A by PCR NEGATIVE NEGATIVE Final   Influenza B by PCR NEGATIVE NEGATIVE Final    Comment: (NOTE) The Xpert Xpress SARS-CoV-2/FLU/RSV plus assay is intended as an aid in the diagnosis of influenza from Nasopharyngeal swab specimens and should not be used as a sole basis for treatment. Nasal washings and aspirates are unacceptable for Xpert Xpress SARS-CoV-2/FLU/RSV testing.  Fact Sheet for Patients: bloggercourse.com  Fact Sheet for Healthcare Providers: seriousbroker.it  This test is not yet approved or cleared by the [place]  FDA and has been authorized for detection and/or diagnosis of SARS-CoV-2 by FDA under an Emergency Use Authorization (EUA). This EUA will remain in effect (meaning this test can be used) for the duration of the COVID-19 declaration under Section 564(b)(1) of the Act, 21 U.S.C. section 360bbb-3(b)(1), unless the authorization is terminated or revoked.     Resp Syncytial Virus by PCR NEGATIVE NEGATIVE Final    Comment: (NOTE) Fact Sheet for Patients: bloggercourse.com  Fact Sheet for Healthcare  Providers: seriousbroker.it  This test is not yet approved or cleared by the [place]  FDA and has been authorized for detection and/or diagnosis of SARS-CoV-2 by FDA under an Emergency Use Authorization (EUA). This EUA will remain in effect (meaning this test can be used) for the duration of the COVID-19 declaration under Section 564(b)(1) of the Act, 21 U.S.C. section 360bbb-3(b)(1), unless the authorization is terminated or revoked.  Performed at [facility], [address] [place], [place] [address]   Culture, blood (Routine X 2) w Reflex to ID Panel     Status: None (Preliminary result)   Collection Time: 02/18/24  6:36 AM   Specimen: BLOOD RIGHT ARM  Result Value Ref Range Status   Specimen Description BLOOD RIGHT ARM  Final   Special Requests BOTTLES DRAWN AEROBIC AND ANAEROBIC  Final   Culture   Final    NO GROWTH 3 DAYS Performed at [facility], [address] [place], [place] [address]    Report Status PENDING  Incomplete  Culture, blood (Routine X 2) w Reflex to ID Panel     Status: None (Preliminary result)   Collection Time: 02/18/24  6:36 AM   Specimen: BLOOD RIGHT HAND  Result Value Ref Range Status   Specimen Description BLOOD RIGHT HAND  Final   Special Requests BOTTLES DRAWN AEROBIC AND ANAEROBIC  Final   Culture   Final    NO GROWTH 3 DAYS Performed at [facility], [address], [place], [place] [address]    Report Status PENDING  Incomplete  Urine Culture (for pregnant, neutropenic or  urologic patients or patients with an indwelling urinary catheter)     Status: None   Collection Time: 02/18/24  4:03 PM   Specimen: Urine, Clean Catch  Result Value Ref Range Status   Specimen Description   Final    URINE, CLEAN CATCH Performed at [facility], [address], [place], [place] [address]    Special Requests   Final    Immunocompromised Performed at [facility], [address], [place], [place] [address]    Culture   Final    NO GROWTH Performed at [facility], [address], [place], [place] [address]    Report Status 02/19/2024 FINAL  Final    Labs: CBC: Recent Labs  Lab 02/16/24 0953 02/17/24 0553 02/18/24 0636 02/19/24 0554 02/20/24 0743 02/20/24 1517 02/21/24 0805  WBC 29.3* 20.9* 24.2* 23.8* 17.4*  --  11.8*  NEUTROABS 22.1*  --   --   --  12.5*  --  8.5*  HGB 7.7* 6.7* 7.6* 7.4* 6.6* 7.4* 7.7*  HCT 21.7* 18.4* 21.5* 20.6* 19.1* 22.2* 22.2*  MCV 91.6 91.1 88.1 86.2 86.0  --  84.4  PLT 514* 379 334 366 378  --  437*   Basic Metabolic Panel: Recent Labs  Lab 02/16/24 0953  NA 137  K 4.0  CL 103  CO2 21*  GLUCOSE 128*  BUN 7  CREATININE 0.56  CALCIUM 8.9   Liver Function Tests: No results for input(s): AST, ALT, ALKPHOS, BILITOT, PROT, ALBUMIN in the last 168 hours. CBG: No results for input(s): GLUCAP in the last 168 hours.  Discharge time spent: {LESS THAN/GREATER UYJW:73611} 30 minutes.  SignedBETHA SIM KNOLL, MD [facility] 02/21/2024

## 2024-02-21 NOTE — Plan of Care (Signed)
  Problem: Education: Goal: Awareness of infection prevention will improve Outcome: Progressing Goal: Awareness of signs and symptoms of anemia will improve Outcome: Progressing   Problem: Self-Care: Goal: Ability to incorporate actions that prevent/reduce pain crisis will improve Outcome: Progressing   Problem: Sensory: Goal: Pain level will decrease with appropriate interventions Outcome: Progressing   Problem: Health Behavior/Discharge Planning: Goal: Ability to manage health-related needs will improve Outcome: Progressing

## 2024-02-22 DIAGNOSIS — D5700 Hb-SS disease with crisis, unspecified: Secondary | ICD-10-CM | POA: Diagnosis not present

## 2024-02-22 NOTE — Progress Notes (Signed)
 SICKLE CELL SERVICE PROGRESS NOTE  Jessica Macias FMW:[id number] DOB: 2005-04-10 DOA: 02/16/2024 PCP: Jolee Madelin Patch, MD  Assessment/Plan: Principal Problem:   Sickle cell anemia with crisis [facility]) Active Problems:   Sickle cell anemia (HCC)   Leukocytosis   Pregnant  Sickle cell anemia with crisis: Patient is doing much better and H&H is stable.  Transition of Dilaudid  PCA and started oral medications.  Patient will be discharged first thing in the morning. Anemia of chronic disease: Continue to monitor H&H. Suspected 1st trimester pregnancy: Patient has had bleeding after the initial hCG checked was elevated.  It was originally 113.  Will recheck today it is down to 29.  With patient bleeding now she may have been originally pregnant but lost the baby or she was never pregnant  in the first place.  She will need an ultrasound soon and follow-up outpatient with recheck of her hCG quantitatively and qualitatively.  Continue supportive care and follow-up with OB/GYN Chronic pain syndrome: Continue chronic home regimen.  Code Status: Full code Family Communication: Full code Disposition Plan: Home when ready  [facility]  Pager [phone]. If 7PM-7AM, please contact night-coverage.  02/22/2024, 8:47 PM  LOS: 6 days   Brief narrative: Jessica Macias  is a 18 y.o. female, with a medical history significant for sickle cell SS disease, chronic sickle cell anemia, chronic leukocytosis, came to the ED with sudden onset of generalize body pain. Symptoms started on last night around midnight, patient woke up with severe pain involving bilateral shoulders, back, chest, and bilateral legs.  Chest discomfort has been sharp bilaterally, worsening with deep breathing. Denies any fever, chills, cough. No urinary symptoms.     Consultants: None  Procedures: Chest x-ray  Antibiotics: None  HPI/Subjective: Patient has done better.  Patient will be discharged in the morning to follow-up  with PCP and OB/GYN  Objective: Vitals:   02/21/24 2305 02/22/24 0201 02/22/24 0311 02/22/24 0550  BP:  127/83  125/80  Pulse:  99  96  Resp:  18 18 18   Temp:  98.9 F (37.2 C)  98.5 F (36.9 C)  TempSrc:  Oral  Oral  SpO2: 100% 97% 98% 100%  Weight:      Height:       Weight change:  No intake or output data in the 24 hours ending 02/22/24 2047   General: Alert, awake, oriented x3, in no acute distress.  HEENT: [place]/AT PEERL, EOMI Neck: Trachea midline,  no masses, no thyromegal,y no JVD, no carotid bruit OROPHARYNX:  Moist, No exudate/ erythema/lesions.  Heart: Regular rate and rhythm, without murmurs, rubs, gallops, PMI non-displaced, no heaves or thrills on palpation.  Lungs: Clear to auscultation, no wheezing or rhonchi noted. No increased vocal fremitus resonant to percussion  Abdomen: Soft, nontender, nondistended, positive bowel sounds, no masses no hepatosplenomegaly noted..  Neuro: No focal neurological deficits noted cranial nerves II through XII grossly intact. DTRs 2+ bilaterally upper and lower extremities. Strength 5 out of 5 in bilateral upper and lower extremities. Musculoskeletal: No warm swelling or erythema around joints, no spinal tenderness noted. Psychiatric: Patient alert and oriented x3, good insight and cognition, good recent to remote recall. Lymph node survey: No cervical axillary or inguinal lymphadenopathy noted.   Data Reviewed: Basic Metabolic Panel: Recent Labs  Lab 02/16/24 0953  NA 137  K 4.0  CL 103  CO2 21*  GLUCOSE 128*  BUN 7  CREATININE 0.56  CALCIUM 8.9   Liver Function Tests: No results for  input(s): AST, ALT, ALKPHOS, BILITOT, PROT, ALBUMIN in the last 168 hours. No results for input(s): LIPASE, AMYLASE in the last 168 hours. No results for input(s): AMMONIA in the last 168 hours. CBC: Recent Labs  Lab 02/16/24 0953 02/17/24 0553 02/18/24 0636 02/19/24 0554 02/20/24 0743 02/20/24 1517 02/21/24 0805   WBC 29.3* 20.9* 24.2* 23.8* 17.4*  --  11.8*  NEUTROABS 22.1*  --   --   --  12.5*  --  8.5*  HGB 7.7* 6.7* 7.6* 7.4* 6.6* 7.4* 7.7*  HCT 21.7* 18.4* 21.5* 20.6* 19.1* 22.2* 22.2*  MCV 91.6 91.1 88.1 86.2 86.0  --  84.4  PLT 514* 379 334 366 378  --  437*   Cardiac Enzymes: No results for input(s): CKTOTAL, CKMB, CKMBINDEX, TROPONINI in the last 168 hours. BNP (last 3 results) No results for input(s): BNP in the last 8760 hours.  ProBNP (last 3 results) No results for input(s): PROBNP in the last 8760 hours.  CBG: No results for input(s): GLUCAP in the last 168 hours.  Recent Results (from the past 240 hours)  Resp panel by RT-PCR (RSV, Flu A&B, Covid) Anterior Nasal Swab     Status: None   Collection Time: 02/16/24 10:02 AM   Specimen: Anterior Nasal Swab  Result Value Ref Range Status   SARS Coronavirus 2 by RT PCR NEGATIVE NEGATIVE Final    Comment: (NOTE) SARS-CoV-2 target nucleic acids are NOT DETECTED.  The SARS-CoV-2 RNA is generally detectable in upper respiratory specimens during the acute phase of infection. The lowest concentration of SARS-CoV-2 viral copies this assay can detect is 138 copies/mL. A negative result does not preclude SARS-Cov-2 infection and should not be used as the sole basis for treatment or other patient management decisions. A negative result may occur with  improper specimen collection/handling, submission of specimen other than nasopharyngeal swab, presence of viral mutation(s) within the areas targeted by this assay, and inadequate number of viral copies(<138 copies/mL). A negative result must be combined with clinical observations, patient history, and epidemiological information. The expected result is Negative.  Fact Sheet for Patients:  bloggercourse.com  Fact Sheet for Healthcare Providers:  seriousbroker.it  This test is no t yet approved or cleared by the [place]  [place] FDA and  has been authorized for detection and/or diagnosis of SARS-CoV-2 by FDA under an Emergency Use Authorization (EUA). This EUA will remain  in effect (meaning this test can be used) for the duration of the COVID-19 declaration under Section 564(b)(1) of the Act, 21 U.S.C.section 360bbb-3(b)(1), unless the authorization is terminated  or revoked sooner.       Influenza A by PCR NEGATIVE NEGATIVE Final   Influenza B by PCR NEGATIVE NEGATIVE Final    Comment: (NOTE) The Xpert Xpress SARS-CoV-2/FLU/RSV plus assay is intended as an aid in the diagnosis of influenza from Nasopharyngeal swab specimens and should not be used as a sole basis for treatment. Nasal washings and aspirates are unacceptable for Xpert Xpress SARS-CoV-2/FLU/RSV testing.  Fact Sheet for Patients: bloggercourse.com  Fact Sheet for Healthcare Providers: seriousbroker.it  This test is not yet approved or cleared by the [place]  FDA and has been authorized for detection and/or diagnosis of SARS-CoV-2 by FDA under an Emergency Use Authorization (EUA). This EUA will remain in effect (meaning this test can be used) for the duration of the COVID-19 declaration under Section 564(b)(1) of the Act, 21 U.S.C. section 360bbb-3(b)(1), unless the authorization is terminated or revoked.     Resp Syncytial  Virus by PCR NEGATIVE NEGATIVE Final    Comment: (NOTE) Fact Sheet for Patients: bloggercourse.com  Fact Sheet for Healthcare Providers: seriousbroker.it  This test is not yet approved or cleared by the [place]  FDA and has been authorized for detection and/or diagnosis of SARS-CoV-2 by FDA under an Emergency Use Authorization (EUA). This EUA will remain in effect (meaning this test can be used) for the duration of the COVID-19 declaration under Section 564(b)(1) of the Act, 21 U.S.C. section  360bbb-3(b)(1), unless the authorization is terminated or revoked.  Performed at [facility], [address] [place], [place] [address]   Culture, blood (Routine X 2) w Reflex to ID Panel     Status: None (Preliminary result)   Collection Time: 02/18/24  6:36 AM   Specimen: BLOOD RIGHT ARM  Result Value Ref Range Status   Specimen Description BLOOD RIGHT ARM  Final   Special Requests BOTTLES DRAWN AEROBIC AND ANAEROBIC  Final   Culture   Final    NO GROWTH 4 DAYS Performed at [facility], [address], [place], [place] [address]    Report Status PENDING  Incomplete  Culture, blood (Routine X 2) w Reflex to ID Panel     Status: None (Preliminary result)   Collection Time: 02/18/24  6:36 AM   Specimen: BLOOD RIGHT HAND  Result Value Ref Range Status   Specimen Description BLOOD RIGHT HAND  Final   Special Requests BOTTLES DRAWN AEROBIC AND ANAEROBIC  Final   Culture   Final    NO GROWTH 4 DAYS Performed at [facility], [address], [place], [place] [address]    Report Status PENDING  Incomplete  Urine Culture (for pregnant, neutropenic or urologic patients or patients with an indwelling urinary catheter)     Status: None   Collection Time: 02/18/24  4:03 PM   Specimen: Urine, Clean Catch  Result Value Ref Range Status   Specimen Description   Final    URINE, CLEAN CATCH Performed at [facility], [address], [place], [place] [address]    Special Requests   Final    Immunocompromised Performed at [facility], [address] [place], [place] [address]    Culture   Final    NO GROWTH Performed at [facility], [address] [place], [place] [address]    Report Status 02/19/2024 FINAL  Final     Studies: DG Chest Port 1 View Result Date: 02/16/2024 EXAM: 1 VIEW(S) XRAY OF THE CHEST 02/16/2024 10:04:00 AM COMPARISON: 08/06/2023 CLINICAL HISTORY: Chest pain, history of sickle cell  disease FINDINGS: LUNGS AND PLEURA: No focal pulmonary opacity. No pleural effusion. No pneumothorax. HEART AND MEDIASTINUM: Moderate cardiomegaly. BONES AND SOFT TISSUES: No acute osseous abnormality. IMPRESSION: 1. Moderate cardiomegaly. Otherwise, no acute cardiopulmonary abnormality. Electronically signed by: Rogelia Myers MD 02/16/2024 10:43 AM EST RP Workstation: [id number]    Scheduled Meds:   Continuous Infusions:    Principal Problem:   Sickle cell anemia with crisis (HCC) Active Problems:   Sickle cell anemia (HCC)   Leukocytosis   Pregnant

## 2024-02-22 NOTE — Hospital Course (Signed)
 Patient with history of sickle cell disease, sickle cell anemia who was admitted with sickle cell pain crisis.  She also has leukocytosis.  As part of patient's initial workup she was found to have a positive pregnancy test with hCG level of 113.  Patient was informed she is likely pregnant.  Started on Dilaudid  PCA but no Toradol  due to suspicion of pregnancy.  IV fluids and supportive care.  Later patient started bleeding.  Repeat serum hCG was only 29.  Not sure why patient was initially pregnant but now losing the baby or the initial levels do not reflect pregnancy.  Patient treated for her sickle cell pain and discharged to follow up with PCP but also outpatient OB/GYN follow-up.  She will have hCG checked once again in the outpatient setting.

## 2024-02-22 NOTE — Plan of Care (Signed)
  Problem: Education: Goal: Knowledge of vaso-occlusive preventative measures will improve Outcome: Completed/Met Goal: Awareness of infection prevention will improve Outcome: Completed/Met Goal: Awareness of signs and symptoms of anemia will improve Outcome: Completed/Met Goal: Long-term complications will improve Outcome: Completed/Met   Problem: Self-Care: Goal: Ability to incorporate actions that prevent/reduce pain crisis will improve Outcome: Completed/Met   Problem: Bowel/Gastric: Goal: Gut motility will be maintained Outcome: Completed/Met   Problem: Tissue Perfusion: Goal: Complications related to inadequate tissue perfusion will be avoided or minimized Outcome: Completed/Met   Problem: Respiratory: Goal: Pulmonary complications will be avoided or minimized Outcome: Completed/Met Goal: Acute Chest Syndrome will be identified early to prevent complications Outcome: Completed/Met   Problem: Fluid Volume: Goal: Ability to maintain a balanced intake and output will improve Outcome: Completed/Met   Problem: Sensory: Goal: Pain level will decrease with appropriate interventions Outcome: Completed/Met   Problem: Health Behavior: Goal: Postive changes in compliance with treatment and prescription regimens will improve Outcome: Completed/Met   Problem: Education: Goal: Knowledge of General Education information will improve Description: Including pain rating scale, medication(s)/side effects and non-pharmacologic comfort measures Outcome: Completed/Met   Problem: Health Behavior/Discharge Planning: Goal: Ability to manage health-related needs will improve Outcome: Completed/Met   Problem: Clinical Measurements: Goal: Ability to maintain clinical measurements within normal limits will improve Outcome: Completed/Met Goal: Will remain free from infection Outcome: Completed/Met Goal: Diagnostic test results will improve Outcome: Completed/Met Goal: Respiratory  complications will improve Outcome: Completed/Met Goal: Cardiovascular complication will be avoided Outcome: Completed/Met   Problem: Activity: Goal: Risk for activity intolerance will decrease Outcome: Completed/Met   Problem: Nutrition: Goal: Adequate nutrition will be maintained Outcome: Completed/Met   Problem: Coping: Goal: Level of anxiety will decrease Outcome: Completed/Met   Problem: Elimination: Goal: Will not experience complications related to bowel motility Outcome: Completed/Met Goal: Will not experience complications related to urinary retention Outcome: Completed/Met   Problem: Pain Managment: Goal: General experience of comfort will improve and/or be controlled Outcome: Completed/Met   Problem: Safety: Goal: Ability to remain free from injury will improve Outcome: Completed/Met   Problem: Skin Integrity: Goal: Risk for impaired skin integrity will decrease Outcome: Completed/Met

## 2024-02-22 NOTE — Discharge Summary (Signed)
 Physician Discharge Summary   Patient: Jessica Macias MRN: [id number] DOB: [birth year]  Admit date:     02/16/2024  Discharge date: 02/22/2024  Discharge Physician: SIM KNOLL   PCP: Jolee Madelin Patch, MD   Recommendations at discharge:   Patient be discharged home.  She was diagnosed with early pregnancy but not conclusive.  Will need outpatient serum hCG check maybe in a week or 2 and possibly referral to OB/GYN  Discharge Diagnoses: Principal Problem:   Sickle cell anemia with crisis (HCC) Active Problems:   Sickle cell anemia (HCC)   Leukocytosis   Pregnant  Resolved Problems:   * No resolved hospital problems. [facility] Course: Patient with history of sickle cell disease, sickle cell anemia who was admitted with sickle cell pain crisis.  She also has leukocytosis.  As part of patient's initial workup she was found to have a positive pregnancy test with hCG level of 113.  Patient was informed she is likely pregnant.  Started on Dilaudid  PCA but no Toradol  due to suspicion of pregnancy.  IV fluids and supportive care.  Later patient started bleeding.  Repeat serum hCG was only 29.  Not sure why patient was initially pregnant but now losing the baby or the initial levels do not reflect pregnancy.  Patient treated for her sickle cell pain and discharged to follow up with PCP but also outpatient OB/GYN follow-up.  She will have hCG checked once again in the outpatient setting.  Assessment and Plan: No notes have been filed under this hospital service. Service: Hospitalist        Consultants: None Procedures performed: Chest x-ray Disposition: Home Diet recommendation:  Discharge Diet Orders (From admission, onward)     Start     Ordered   02/22/24 0000  Diet - low sodium heart healthy        02/22/24 0810           Regular diet DISCHARGE MEDICATION: Allergies as of 02/22/2024   No Known Allergies      Medication List     STOP taking these medications     hydroxyurea  400 MG capsule Commonly known as: DROXIA    predniSONE  10 MG tablet Commonly known as: DELTASONE        TAKE these medications    cetirizine  10 MG tablet Commonly known as: ZyrTEC  Allergy Take 1 tablet (10 mg total) by mouth daily.   folic acid  1 MG tablet Commonly known as: FOLVITE  Take 1 mg by mouth daily.   oxyCODONE  5 MG immediate release tablet Commonly known as: Oxy IR/ROXICODONE  Take 1 tablet (5 mg total) by mouth every 4 (four) hours as needed for breakthrough pain.   promethazine -dextromethorphan  6.25-15 MG/5ML syrup Commonly known as: PROMETHAZINE -DM Take 5 mLs by mouth 3 (three) times daily as needed for cough.   Vitamin D  (Ergocalciferol ) 1.25 MG (50000 UNIT) Caps capsule Commonly known as: DRISDOL  Take 50,000 Units by mouth every Wednesday.        Discharge Exam: Filed Weights   02/16/24 0938  Weight: 65.8 kg   Constitutional: NAD, calm, comfortable Eyes: PERRL, lids and conjunctivae normal ENMT: Mucous membranes are moist. Posterior pharynx clear of any exudate or lesions.Normal dentition.  Neck: normal, supple, no masses, no thyromegaly Respiratory: clear to auscultation bilaterally, no wheezing, no crackles. Normal respiratory effort. No accessory muscle use.  Cardiovascular: Regular rate and rhythm, no murmurs / rubs / gallops. No extremity edema. 2+ pedal pulses. No carotid bruits.  Abdomen: no tenderness, no masses palpated. No  hepatosplenomegaly. Bowel sounds positive.  Musculoskeletal: Good range of motion, no joint swelling or tenderness, Skin: no rashes, lesions, ulcers. No induration Neurologic: CN 2-12 grossly intact. Sensation intact, DTR normal. Strength 5/5 in all 4.  Psychiatric: Normal judgment and insight. Alert and oriented x 3. Normal mood   Condition at discharge: good  The results of significant diagnostics from this hospitalization (including imaging, microbiology, ancillary and laboratory) are listed below for  reference.   Imaging Studies: DG Chest Port 1 View Result Date: 02/16/2024 EXAM: 1 VIEW(S) XRAY OF THE CHEST 02/16/2024 10:04:00 AM COMPARISON: 08/06/2023 CLINICAL HISTORY: Chest pain, history of sickle cell disease FINDINGS: LUNGS AND PLEURA: No focal pulmonary opacity. No pleural effusion. No pneumothorax. HEART AND MEDIASTINUM: Moderate cardiomegaly. BONES AND SOFT TISSUES: No acute osseous abnormality. IMPRESSION: 1. Moderate cardiomegaly. Otherwise, no acute cardiopulmonary abnormality. Electronically signed by: Rogelia Myers MD 02/16/2024 10:43 AM EST RP Workstation: [id number]    Microbiology: Results for orders placed or performed during the hospital encounter of 02/16/24  Resp panel by RT-PCR (RSV, Flu A&B, Covid) Anterior Nasal Swab     Status: None   Collection Time: 02/16/24 10:02 AM   Specimen: Anterior Nasal Swab  Result Value Ref Range Status   SARS Coronavirus 2 by RT PCR NEGATIVE NEGATIVE Final    Comment: (NOTE) SARS-CoV-2 target nucleic acids are NOT DETECTED.  The SARS-CoV-2 RNA is generally detectable in upper respiratory specimens during the acute phase of infection. The lowest concentration of SARS-CoV-2 viral copies this assay can detect is 138 copies/mL. A negative result does not preclude SARS-Cov-2 infection and should not be used as the sole basis for treatment or other patient management decisions. A negative result may occur with  improper specimen collection/handling, submission of specimen other than nasopharyngeal swab, presence of viral mutation(s) within the areas targeted by this assay, and inadequate number of viral copies(<138 copies/mL). A negative result must be combined with clinical observations, patient history, and epidemiological information. The expected result is Negative.  Fact Sheet for Patients:  bloggercourse.com  Fact Sheet for Healthcare Providers:  seriousbroker.it  This test  is no t yet approved or cleared by the [place]  FDA and  has been authorized for detection and/or diagnosis of SARS-CoV-2 by FDA under an Emergency Use Authorization (EUA). This EUA will remain  in effect (meaning this test can be used) for the duration of the COVID-19 declaration under Section 564(b)(1) of the Act, 21 U.S.C.section 360bbb-3(b)(1), unless the authorization is terminated  or revoked sooner.       Influenza A by PCR NEGATIVE NEGATIVE Final   Influenza B by PCR NEGATIVE NEGATIVE Final    Comment: (NOTE) The Xpert Xpress SARS-CoV-2/FLU/RSV plus assay is intended as an aid in the diagnosis of influenza from Nasopharyngeal swab specimens and should not be used as a sole basis for treatment. Nasal washings and aspirates are unacceptable for Xpert Xpress SARS-CoV-2/FLU/RSV testing.  Fact Sheet for Patients: bloggercourse.com  Fact Sheet for Healthcare Providers: seriousbroker.it  This test is not yet approved or cleared by the [place]  FDA and has been authorized for detection and/or diagnosis of SARS-CoV-2 by FDA under an Emergency Use Authorization (EUA). This EUA will remain in effect (meaning this test can be used) for the duration of the COVID-19 declaration under Section 564(b)(1) of the Act, 21 U.S.C. section 360bbb-3(b)(1), unless the authorization is terminated or revoked.     Resp Syncytial Virus by PCR NEGATIVE NEGATIVE Final    Comment: (NOTE) Fact Sheet for  Patients: bloggercourse.com  Fact Sheet for Healthcare Providers: seriousbroker.it  This test is not yet approved or cleared by the [place]  FDA and has been authorized for detection and/or diagnosis of SARS-CoV-2 by FDA under an Emergency Use Authorization (EUA). This EUA will remain in effect (meaning this test can be used) for the duration of the COVID-19 declaration under Section  564(b)(1) of the Act, 21 U.S.C. section 360bbb-3(b)(1), unless the authorization is terminated or revoked.  Performed at [facility], [address] [place], [place] [address]   Culture, blood (Routine X 2) w Reflex to ID Panel     Status: None (Preliminary result)   Collection Time: 02/18/24  6:36 AM   Specimen: BLOOD RIGHT ARM  Result Value Ref Range Status   Specimen Description BLOOD RIGHT ARM  Final   Special Requests BOTTLES DRAWN AEROBIC AND ANAEROBIC  Final   Culture   Final    NO GROWTH 4 DAYS Performed at [facility], [address], [place], [place] [address]    Report Status PENDING  Incomplete  Culture, blood (Routine X 2) w Reflex to ID Panel     Status: None (Preliminary result)   Collection Time: 02/18/24  6:36 AM   Specimen: BLOOD RIGHT HAND  Result Value Ref Range Status   Specimen Description BLOOD RIGHT HAND  Final   Special Requests BOTTLES DRAWN AEROBIC AND ANAEROBIC  Final   Culture   Final    NO GROWTH 4 DAYS Performed at [facility], [address] [place], [place] [address]    Report Status PENDING  Incomplete  Urine Culture (for pregnant, neutropenic or urologic patients or patients with an indwelling urinary catheter)     Status: None   Collection Time: 02/18/24  4:03 PM   Specimen: Urine, Clean Catch  Result Value Ref Range Status   Specimen Description   Final    URINE, CLEAN CATCH Performed at [facility], [address] [place], [place] [address]    Special Requests   Final    Immunocompromised Performed at [facility], [address], [place], [place] [address]    Culture   Final    NO GROWTH Performed at [facility], [address], [place], [place] [address]    Report Status 02/19/2024 FINAL  Final    Labs: CBC: Recent Labs  Lab 02/16/24 0953 02/17/24 0553 02/18/24 0636 02/19/24 0554 02/20/24 0743 02/20/24 1517 02/21/24 0805  WBC 29.3* 20.9*  24.2* 23.8* 17.4*  --  11.8*  NEUTROABS 22.1*  --   --   --  12.5*  --  8.5*  HGB 7.7* 6.7* 7.6* 7.4* 6.6* 7.4* 7.7*  HCT 21.7* 18.4* 21.5* 20.6* 19.1* 22.2* 22.2*  MCV 91.6 91.1 88.1 86.2 86.0  --  84.4  PLT 514* 379 334 366 378  --  437*   Basic Metabolic Panel: Recent Labs  Lab 02/16/24 0953  NA 137  K 4.0  CL 103  CO2 21*  GLUCOSE 128*  BUN 7  CREATININE 0.56  CALCIUM 8.9   Liver Function Tests: No results for input(s): AST, ALT, ALKPHOS, BILITOT, PROT, ALBUMIN in the last 168 hours. CBG: No results for input(s): GLUCAP in the last 168 hours.  Discharge time spent: greater than 30 minutes.  SignedBETHA SIM KNOLL, MD [facility] 02/22/2024

## 2024-02-22 NOTE — Plan of Care (Signed)
  Problem: Education: Goal: Awareness of infection prevention will improve Outcome: Progressing Goal: Awareness of signs and symptoms of anemia will improve Outcome: Progressing Goal: Long-term complications will improve Outcome: Progressing   Problem: Self-Care: Goal: Ability to incorporate actions that prevent/reduce pain crisis will improve Outcome: Progressing   Problem: Bowel/Gastric: Goal: Gut motility will be maintained Outcome: Progressing

## 2024-02-23 LAB — BPAM RBC
Blood Product Expiration Date: 202512122359
Blood Product Expiration Date: 202512252359
ISSUE DATE / TIME: 202511251636
ISSUE DATE / TIME: 202511281134
Unit Type and Rh: 5100
Unit Type and Rh: 5100

## 2024-02-23 LAB — TYPE AND SCREEN
ABO/RH(D): O POS
Antibody Screen: NEGATIVE
Unit division: 0
Unit division: 0

## 2024-02-23 LAB — CULTURE, BLOOD (ROUTINE X 2)
Culture: NO GROWTH
Culture: NO GROWTH
